# Patient Record
Sex: Female | Born: 1970
Health system: Southern US, Community
[De-identification: ages and names within clinical notes are randomized; demographics above are authoritative.]

## PROBLEM LIST (undated history)

## (undated) DIAGNOSIS — K5090 Crohn's disease, unspecified, without complications: Secondary | ICD-10-CM

## (undated) DIAGNOSIS — T7840XA Allergy, unspecified, initial encounter: Secondary | ICD-10-CM

## (undated) DIAGNOSIS — G43909 Migraine, unspecified, not intractable, without status migrainosus: Secondary | ICD-10-CM

## (undated) DIAGNOSIS — I10 Essential (primary) hypertension: Secondary | ICD-10-CM

## (undated) DIAGNOSIS — B009 Herpesviral infection, unspecified: Secondary | ICD-10-CM

## (undated) DIAGNOSIS — G35 Multiple sclerosis: Secondary | ICD-10-CM

## (undated) HISTORY — PX: UMBILICAL HERNIA REPAIR: SHX196

## (undated) HISTORY — PX: CRYOTHERAPY: SHX1416

## (undated) HISTORY — PX: FOOT SURGERY: SHX648

## (undated) HISTORY — DX: Migraine, unspecified, not intractable, without status migrainosus: G43.909

## (undated) HISTORY — DX: Multiple sclerosis: G35

## (undated) HISTORY — DX: Herpesviral infection, unspecified: B00.9

## (undated) HISTORY — DX: Allergy, unspecified, initial encounter: T78.40XA

## (undated) HISTORY — PX: LAPAROSCOPY: SHX197

---

## 1999-08-14 ENCOUNTER — Other Ambulatory Visit: Admission: RE | Admit: 1999-08-14 | Discharge: 1999-08-14 | Payer: Self-pay | Admitting: *Deleted

## 1999-11-26 HISTORY — PX: LASIK: SHX215

## 2000-08-13 ENCOUNTER — Other Ambulatory Visit: Admission: RE | Admit: 2000-08-13 | Discharge: 2000-08-13 | Payer: Self-pay | Admitting: Obstetrics and Gynecology

## 2001-08-04 ENCOUNTER — Other Ambulatory Visit: Admission: RE | Admit: 2001-08-04 | Discharge: 2001-08-04 | Payer: Self-pay | Admitting: Obstetrics and Gynecology

## 2002-08-04 ENCOUNTER — Other Ambulatory Visit: Admission: RE | Admit: 2002-08-04 | Discharge: 2002-08-04 | Payer: Self-pay | Admitting: Obstetrics and Gynecology

## 2002-12-31 ENCOUNTER — Ambulatory Visit (HOSPITAL_COMMUNITY): Admission: RE | Admit: 2002-12-31 | Discharge: 2002-12-31 | Payer: Self-pay | Admitting: Obstetrics and Gynecology

## 2002-12-31 ENCOUNTER — Encounter: Payer: Self-pay | Admitting: Obstetrics and Gynecology

## 2003-08-25 ENCOUNTER — Other Ambulatory Visit: Admission: RE | Admit: 2003-08-25 | Discharge: 2003-08-25 | Payer: Self-pay | Admitting: Obstetrics and Gynecology

## 2003-10-12 ENCOUNTER — Ambulatory Visit: Admission: RE | Admit: 2003-10-12 | Discharge: 2003-10-12 | Payer: Self-pay | Admitting: Gynecologic Oncology

## 2003-12-01 ENCOUNTER — Encounter (INDEPENDENT_AMBULATORY_CARE_PROVIDER_SITE_OTHER): Payer: Self-pay | Admitting: *Deleted

## 2003-12-01 ENCOUNTER — Encounter (INDEPENDENT_AMBULATORY_CARE_PROVIDER_SITE_OTHER): Payer: Self-pay | Admitting: Specialist

## 2003-12-01 ENCOUNTER — Ambulatory Visit (HOSPITAL_COMMUNITY): Admission: RE | Admit: 2003-12-01 | Discharge: 2003-12-01 | Payer: Self-pay | Admitting: Obstetrics and Gynecology

## 2004-06-05 ENCOUNTER — Encounter: Admission: RE | Admit: 2004-06-05 | Discharge: 2004-06-05 | Payer: Self-pay | Admitting: Internal Medicine

## 2005-01-09 ENCOUNTER — Other Ambulatory Visit: Admission: RE | Admit: 2005-01-09 | Discharge: 2005-01-09 | Payer: Self-pay | Admitting: Obstetrics and Gynecology

## 2005-02-13 ENCOUNTER — Ambulatory Visit (HOSPITAL_COMMUNITY): Admission: RE | Admit: 2005-02-13 | Discharge: 2005-02-13 | Payer: Self-pay | Admitting: Obstetrics and Gynecology

## 2005-09-13 ENCOUNTER — Encounter (HOSPITAL_COMMUNITY): Admission: AD | Admit: 2005-09-13 | Discharge: 2005-10-13 | Payer: Self-pay | Admitting: Obstetrics and Gynecology

## 2006-01-09 ENCOUNTER — Other Ambulatory Visit: Admission: RE | Admit: 2006-01-09 | Discharge: 2006-01-09 | Payer: Self-pay | Admitting: Obstetrics and Gynecology

## 2007-06-02 ENCOUNTER — Ambulatory Visit (HOSPITAL_COMMUNITY): Admission: RE | Admit: 2007-06-02 | Discharge: 2007-06-02 | Payer: Self-pay | Admitting: Obstetrics and Gynecology

## 2007-06-08 ENCOUNTER — Ambulatory Visit (HOSPITAL_COMMUNITY): Admission: RE | Admit: 2007-06-08 | Discharge: 2007-06-08 | Payer: Self-pay | Admitting: Obstetrics and Gynecology

## 2007-06-27 ENCOUNTER — Inpatient Hospital Stay (HOSPITAL_COMMUNITY): Admission: AD | Admit: 2007-06-27 | Discharge: 2007-07-03 | Payer: Self-pay | Admitting: Obstetrics and Gynecology

## 2007-06-28 ENCOUNTER — Encounter (INDEPENDENT_AMBULATORY_CARE_PROVIDER_SITE_OTHER): Payer: Self-pay | Admitting: Obstetrics and Gynecology

## 2007-07-19 ENCOUNTER — Inpatient Hospital Stay (HOSPITAL_COMMUNITY): Admission: AD | Admit: 2007-07-19 | Discharge: 2007-07-19 | Payer: Self-pay | Admitting: Obstetrics and Gynecology

## 2009-10-10 ENCOUNTER — Encounter: Admission: RE | Admit: 2009-10-10 | Discharge: 2009-10-10 | Payer: Self-pay | Admitting: Family Medicine

## 2010-12-16 ENCOUNTER — Encounter: Payer: Self-pay | Admitting: Obstetrics and Gynecology

## 2010-12-16 ENCOUNTER — Encounter: Payer: Self-pay | Admitting: Diagnostic Neuroimaging

## 2011-03-08 ENCOUNTER — Other Ambulatory Visit: Payer: Self-pay | Admitting: Family Medicine

## 2011-03-08 DIAGNOSIS — E049 Nontoxic goiter, unspecified: Secondary | ICD-10-CM

## 2011-04-09 NOTE — H&P (Signed)
Ashley Diaz, Ashley Diaz                 ACCOUNT NO.:  [id number]   MEDICAL RECORD NO.:  [id number]          PATIENT TYPE:  INP   LOCATION:  9157                          FACILITY:  [facility]   PHYSICIAN:  Eli Hose, M.D.DATE OF BIRTH:  Feb 20, 1971   DATE OF ADMISSION:  06/27/2007  DATE OF DISCHARGE:                              HISTORY & PHYSICAL   HISTORY OF PRESENT ILLNESS:  Ashley Diaz is a 40 year old gravida 2, para  0-0-1-0 at 23-4/7 weeks, who presented tonight complaining of shortness  of breath and fast heart rate. She reports this had started over the  last 24 hours. She denies any leaking or bleeding. Reports positive  fetal movement. She does not have any history of headache, visual  symptoms, or epigastric pain. She has no history of asthma or  hypertension. While in Maternity Admissions Unit, she was noted to have  elevated blood pressures, proteinuria and elevations of her liver  function tests and diminished platelets. She is therefore to be admitted  for further care secondary to pre-eclampsia and possible HELLP syndrome.  Pregnancy has been remarkable for:  1. Advanced maternal age. The patient did have an elevated risk of      Down's syndrome of 1 to 5 with her first trimester screen. She had      an amnio that on preliminary results showed normal chromosome      number but had a balanced Robertsonian 13 to 22 translocation      without clinical stigmata identified. Followup scan did show still      a questionable abnormal hepatic vein, a cystic nuchal region, and a      questionable VSD and dilated right atrium. The baby had had a      possible cystic hygroma on the first trimester screen.  2. History of infertility with this being a spontaneous conception.  3. First trimester spotting.  4. 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  5. History of atypical cells on path with negative HPV with repeat due      in August of 2008.   PRENATAL LABORATORY DATA:   Blood type is A positive. Rh antibody  negative. VDRL non-reactive. Rubella titer positive. Hepatitis B surface  antigen negative. Sickle test was negative. Gonorrhea and Chlamydia  cultures were negative in the first trimester. Pap showed ascus in  February 2008 with negative HPV. Rubella titer was immune. HIV was non-  reactive. Hemoglobin upon entering the practice was 13.3.   HISTORY OF PRESENT PREGNANCY:  The patient entered care at approximately  10 weeks. She desired first trimester screening but initially declined  amnio. On first trimester screen, she reported that she had the  previously reported elevated Down syndrome risk of 1 in 5. There was  also a cystic hygroma noted on amniocentesis. Amnio results were  reviewed with Dr. __________ at [facility]. [facility] evaluation final  results showed a balanced Robertsonian 14 to 50 translocation with a  female without clinical stigmata. Chromosome analysis was done of both  patient and her husband and the father was  noted to have this 13 to 22  translocation. The patient had a consult at Maternal Fetal Medicine. No  consequences were anticipated with this pregnancy. Offspring should be  fine but an amnio was recommended for future pregnancy. There were no  associated structural anomalies identified with this translocation.  However, on ultrasound, there was a questionable abnormal hepatic vein,  a cystic nuchal region, and a questionable VSD and dilated right atrium.  Fetal echocardiogram was planned and maternal fetal medicine consult was  also planned as well. The fetal echocardiogram was yet to be performed.  The patient then in the last 12 hours, began to have the symptoms as  noted previously.   OBSTETRICAL HISTORY:  In 2006, patient had a spontaneous miscarriage.   PAST MEDICAL HISTORY:  In 2008 she had an abnormal pap and plan was to  repeat that in August. She has used Clomid in the past but this was a  spontaneous  conception. She has been treated with Valtrex for HSV but  has had no recent or current lesions. She had a yeast infection after  antibiotic therapy. She reports usual childhood illnesses. She has a  history of a UTI.   PAST SURGICAL HISTORY:  Includes wisdom teeth removed in the past. Laser  surgery and surgery on her feet for flat feet at 31 to 40 years of age  and a hernia repair in 1993.   FAMILY HISTORY:  Her mother, maternal grandmother, and maternal  grandfather are all hypertensive. Mother has varicosities. Her mother,  sister, father, and maternal grandfather are all adult onset diabetes.  Her mother has thyroid disease. Her maternal grandmother had a stroke.  The patient's sister has migraines. Her father has rheumatoid arthritis.  Her father also has lung cancer. Her paternal grandmother had a brain  tumor. Maternal grandmother had a history of depression.   GENETIC HISTORY:  Remarkable for the patients' age of 56 and there are  twins on the father's side of the family.   SOCIAL HISTORY:  The patient is married to the father of the baby. He is  involved and supportive. His name is Deania Siguenza. The patient is  African-American and of the [place] faith. She is college educated.  She is a Airline pilot. Her husband is college  educated. He is a part Press photographer at [employer]. She denies any alcohol,  drug, or tobacco use during this pregnancy. She has been followed by the  physician service at [facility].   PHYSICAL EXAMINATION:  VITAL SIGNS:  Blood pressures in Maternity  Admissions have ranged from 223 to 361 systolic to 91 to 224 systolic.  The patient is having no pain. Other vital signs are stable. Pulse is  66. Respiratory rate 18 to 20. O2 saturation 99%.  HEENT:  Within normal limits.  LUNGS:  Breath sounds are clear.  HEART:  Regular rate and rhythm. Without murmur.  BREAST:  Soft and non-tender.   ABDOMEN:  Fundal height is approximately 23 to 24 cm. Soft, nontender,  with no right upper quadrant tenderness.  PELVIC:  Examination deferred.  EXTREMITIES:  Deep tendon reflexes 3+ without clonus. There is a trace  edema noted.   LABORATORY DATA:  CBC shows a hemoglobin of 11.3. Hemoglobin of 32.8.  White blood cell count 9.9. Platelet count of 126,000. Comprehensive  metabolic panel shows sodium of 136, potassium 3.9, chloride 109, CO2  19, glucose 89, BUN 17, creatinine 0.88, calcium 8.5,  total protein 5.4,  albumin 2.4. SGOT was elevated at 48 and SGPT was 51. Uric acid was 6.9  and LDH was 306, which was elevated. Urine sample shows specific gravity  of 1.020. Moderate hemoglobin, although there are less 0 to 2 RBC's,  greater than 300 mg of protein was noted in the specimen with negative  leukocyte esterase. Fetal heart rate is reassuring for gestational age  and no contractions were noted.   IMPRESSION:  1. Intrauterine pregnancy  at 23-5/7 weeks.  2. Pre-eclampsia with possible HELLP.  3. Fetus with cystic hygroma and possible heart issues.   PLAN:  1. Admit to the [facility] for consult with Dr.      Kennis Carina as attending physician.  2. Implementation of magnesium sulfate protocol.  3. Labetalol IV for elevated blood pressure greater than 160/105.  4. Will repeat PIH labs at 8:00 p.m.  5. Betamethasone 12.5 mg IM q.12 hours x2 doses.  6. Close observation of maternal fetal status.  7. Reviewed issue of pre-eclampsia and possible HELLP syndrome with      patient and her husband. Questions were answered and they seemed to      understand the current plan of care.      Cathlean Marseilles Cira Servant, C.N.M.      Eli Hose, M.D.  Electronically Signed    VLL/MEDQ  D:  06/27/2007  T:  06/27/2007  Job:  [id number]

## 2011-04-09 NOTE — Discharge Summary (Signed)
Ashley Diaz, LANZO                 ACCOUNT NO.:  [id number]   MEDICAL RECORD NO.:  [id number]          PATIENT TYPE:  INP   LOCATION:  9305                          FACILITY:  [facility]   PHYSICIAN:  Eldred Manges, M.D.DATE OF BIRTH:  1971/08/05   DATE OF ADMISSION:  06/27/2007  DATE OF DISCHARGE:  07/03/2007                               DISCHARGE SUMMARY   ADMISSION DIAGNOSES:  1. Intrauterine pregnancy at 23-5/7 weeks.  2. Preeclampsia with possible HELLP syndrome.  3. Fetus with cystic hygroma and possible cardiac anomaly.   DISCHARGE DIAGNOSES:  1. Intrauterine pregnancy at 23-5/7 weeks.  2. Preeclampsia.  3. HELLP syndrome.  4. Oligohydramnios.  5. Fibroids.  6. Severe prematurity with neonatal loss.  7. Postpartum hypertension   PROCEDURES:  1. Primary low transverse cesarean section.  2. Spinal anesthesia.   HOSPITAL COURSE:  Ashley Diaz is a 40 year old gravida 1, para 0 at 51-  5/7 weeks who presented to the maternity admissions unit complaining of  shortness of breath and heart pounding today. Symptoms were more  pronounced while lying down.  She was seen in maternity admissions unit  on the afternoon of June 27, 2007, and she was found to have elevated  blood pressure in 160-180 over 98-103. Her SGOT, SGPT were elevated at  48 and 51 respectively.  She had greater than 300 mg of protein on a  voided specimen.  Her platelet count was 126.  She was therefore  admitted for presumptive preeclampsia with possible HELLP syndrome.   Her pregnancy had been remarkable for:  1. Advanced maternal age. Amnio was performed with a balance      translocation Robertsonian noted.  There was no clinical stigmata      associated with this. There was a questionable abnormal hepatic      vein cystic nuchal region questionable ventricular septal defect      and dilated right atrium.  2. History of infertility.  3. First trimester spotting.  4. History of HSV II with no recent or  current lesions.  5. Atypical cells on Pap with negative high-risk HPV and a plan for      repeat in August 2008.   HOSPITAL COURSE:  The patient was admitted to the antenatal unit. She  was placed on magnesium sulfate.  She was given betamethasone.  She also  was placed on IV labetalol for blood pressure management on an as-needed  basis. Betamethasone was repeated in 12 hours. Neonatal medicine was  consulted, maternal fetal medicine was also consulted. On the evening of  August 2, repeat values of platelets were 119,000, SGOT was 52, SGPT was  54, LDH was 346. All these represented changes from the original values.  Blood pressure was still 155/99. Dr. Raphael Gibney held a long, station with  the patient her husband regarding the options for care.  They seemed to  understand that the morbidity and mortality risk for infant were very  high, that she may require classical cesarean. They did elect to proceed  with C-section if needed. Labs were checked again at 3:35 in the  morning  on June 28, 2007, platelets were down to 101,000, SGOT and SGPT were 51  respectively.  The patient was taken to the operating room where Dr.  Raphael Gibney performed a primary low transverse cesarean section.  Infant  was a female, weight 441 grams.  Apgars were 3, 5 and 7. Cord pH was  7.23.  Infant of course was taken to NICU and placenta was taken to  pathology. By later that day baby was fairly stable given its guarded  condition.  Blood pressures were improved.  The patient remained on  magnesium sulfate. Labs were checked later that day and SGOT and SGPT  were 52 and 50 respectively.  Platelets were down to 97,000, hemoglobin  was 11.8.  Urine output was fairly adequate and blood pressure was  stabilizing.  On the morning of August 4, blood pressures again were  improving.  Her SGOT was 45, SGPT was 46, platelets were 95,000.  Magnesium sulfate was discontinued later that afternoon. The infant did  pass away on  the late evening of August 4, early on the morning of  August 5. Chaplaincy was contacted, there were some issues with cardiac  anomalies on the baby. The patient did have the opportunity to hold and  cuddle the infant prior to the infant's passing. The patient originally  had gone out to the floor but then was transferred back to AICU from  antenatal for further preeclampsia care. At 5:00 a.m. on August 5,  hemoglobin was 8.3, SGOT was up to 122 and SGPT was 118. Platelet count  on August 5, was found to be 65,000.  This eventually represented the  nadir of her platelet count. She was placed on HCTZ 25 mg, Procardia XL  through 30 mg daily was begun and she was on a p.r.n. labetalol dosing  for elevated blood pressure. PIH labs, ANA, anticardiolipin antibody and  lupus anticoagulant antibodies were drawn on August 5. Later that day  SGOT, SGPT were still elevated at 114 and 117, platelet count was  69,000. Procardia dose was increased to 60 mg p.o.  By postop day #3 the  patient was coping emotionally but was certainly struggling with a loss  of her baby.  She tended to get tachycardia with ambulation but was  having no dizziness or shortness of breath. Her SGOT was 149, SGPT was  157.  Her platelet count was 67. She had lost approximately 2-1/2 pounds  of weight, urine output was adequate.  Orthostatics were stable. On  August 7, SGOT and SGPT were 183 and 215.  They had been elevated to a  max of 230 and 247. On August 6, platelet count was 70,000, hemoglobin  was 9.3, ANA, lupus anticoagulant were both negative. On the morning of  August 8, the patient was feeling better.  She was awaiting autopsy  results and the memorial service for her infant was scheduled for that  day. Her blood pressures were in the 120-168 over 90-104.  She was  placed on Procardia 90 mg, she is also to be on HCTZ 25 mg p.o. daily.  Her SGOT and SGPT had diminished to 115 and 174 retrospectively,  platelet count  was 73. Dr. Leo Grosser was consulted and her incision was  clean, dry and intact.  Her physical exam was otherwise within normal  limits.  Dr. Leo Grosser was consulted and the decision was made to  discharge the patient home.   DISCHARGE CONDITION:  Was stable.   DISCHARGE INSTRUCTIONS:  The patient is to continue to monitor for signs  and symptoms of worsening blood pressure with headache, visual symptoms,  epigastric pain or worsening swelling. The discharge instructions are  per the usual postop C-section instructions.   DISCHARGE MEDICATIONS:  1. Procardia 90 mg XL daily.  2. HCTZ 25 mg one p.o. daily.  3. Motrin 600 grams p.o. q.6 hours p.r.n. pain.  4. Percocet 5/325 one to two p.o. q.3-4 hours p.r.n. pain.   Discharge follow-up will occur on Monday with smart start nurse for  blood pressure check and support to the patient and her husband for  their loss was provided. They also were given information on the  neonatal loss program.      Jocelyn Lamer L. Cira Servant, C.N.M.      Eldred Manges, M.D.  Electronically Signed    VLL/MEDQ  D:  07/07/2007  T:  07/07/2007  Job:  [id number]

## 2011-04-09 NOTE — Op Note (Signed)
Ashley Diaz, Ashley Diaz                 ACCOUNT NO.:  [id number]   MEDICAL RECORD NO.:  [id number]          PATIENT TYPE:  INP   LOCATION:  9157                          FACILITY:  [facility]   PHYSICIAN:  Eli Hose, M.D.DATE OF BIRTH:  08/09/1971   DATE OF PROCEDURE:  06/28/2007  DATE OF DISCHARGE:                               OPERATIVE REPORT   PREOPERATIVE DIAGNOSES:  1. 23-5/[redacted] weeks gestation.  2. Preeclampsia.  3. Possible hemolysis, elevated liver enzymes and low platelet count      help syndrome.   POSTOPERATIVE DIAGNOSIS:  1. 23-5/[redacted] weeks gestation.  2. Preeclampsia.  3. Possible hemolysis, elevated liver enzymes and low platelet count      help syndrome.  4. Oligohydramnios and  5. Fibroid uterus.   PROCEDURE:  Primary low transverse cesarean section.   SURGEON:  Eli Hose, M.D.   FIRST ASSISTANT:  Donnel Saxon, C.N.M.   ANESTHESIA:  Spinal.   DISPOSITION:  Ashley Diaz is a 40 year old female, gravida 1, para 0,  who presents at 23-5/[redacted] weeks gestation.  She has been followed at the  [facility].  Her  pregnancy has been complicated by the fact that she was found to have a  balanced translocation (Robinsonium).  This was thought to have no  clinical significance.  There was a question, however,. of an abnormal  hepatic vein, and abnormal nuchal region.  There was a question of a  ventricular septal defect and the question of a dilated right atrium.  The patient presented on June 27, 2007 and was noted to have elevated  blood pressures, significant proteinuria, low platelets, and elevated  liver enzymes.  The patient was immediately started on magnesium therapy  and her labs were repeated.  Her platelet count was noted to decrease  from 126,000 to 119,000, and then 101,000.  Multiple discussions were  held with the patient and her husband about our management options.  They understood that there was at  best a 50% chance that their daughter  would survive if delivered at this point, but they also understood that  prolonging the pregnancy placed the mother at significant risk which in  turn placed the baby at risk.  After much consideration, the couple  decided that they did want to proceed with cesarean delivery.  We  discussed the possibility that a classical cesarean section would be  required which would subsequently mean that all future pregnancies will  require cesarean delivery.  The risk and benefits of cesarean section  were reviewed including, but not limited to, anesthetic complications,  bleeding, infection, and possible damage to surrounding organs.   FINDINGS:  A 441 grams female infant (Ashley Diaz) was delivered from a  cephalic presentation.  The Apgar scores were 3 at one minute and 5 at  five minutes and 7 at 10 minutes.  The arterial cord blood pH was 7.23.  There was a decreased amount of amniotic fluid noted.  The ovaries  appeared normal for the gravid state.  The fallopian tubes were normal  and  they were noted to have hydatid cyst on the right.  The uterus was  normal for the gravid state except that there were two fibroid on the  anterior fundus of the uterus measuring less than 2 cm in size each.   DESCRIPTION OF PROCEDURE:  The patient was taken to the operating room  where a spinal anesthetic was given.  The patient's abdomen, perineum,  and vagina were prepped with multiple layers of Betadine.  A Foley  catheter was placed in the bladder.  The patient was then sterilely  draped.  The lower abdomen was injected with 10 mL of 0.5% Marcaine with  epinephrine.  A low transverse incision was made in the abdomen and then  carried sharply through the subcutaneous tissue, fascia, and the  anterior peritoneum.  The bladder flap was developed.  It was determined  that we would be able to perform a low transverse cesarean section.  An  incision was made in the lower  uterine segment and it was extended in a  low transverse fashion.  The membranes were ruptured and a decreased  amount of amniotic fluid was noted.  The infant was then delivered from  cephalic presentation without difficulty.  The cord was clamped and cut.  The infant was handed to the awaiting pediatric team.  Routine cord  blood studies were obtained.  The placenta was removed.  The placenta  was sent to pathology for evaluation.  The uterine cavity was then  cleaned of amniotic fluid, clotted blood, and membranes.  The uterine  incision was closed using a running locking suture of 2-0 Vicryl  followed by imbricating suture of 2-0 Vicryl.  Hemostasis was adequate.  The pelvis was vigorously irrigated.  The anterior peritoneum was closed  using a running suture of 0 Vicryl.  The fascia and the subcutaneous  layer were irrigated.  The fascia was closed using a running suture of 0  Vicryl followed by three interrupted sutures of 0 Vicryl.  The  subcutaneous layer was closed using a running suture of 0 Vicryl.  The  skin was reapproximated using a subcuticular suture of 3-0 Monocryl.  Sponge, needle, and instrument counts were correct on two occasions.  The estimated blood loss for the procedure was 800 mL.  The patient was  noted to drain clear yellow urine at the end of her procedure.  The  patient tolerated her procedure well.  The patient was taken to the  recovery room in stable condition.  She will then be observed carefully  and placed back on magnesium.  The infant was taken to the neonatal  intensive care unit in guarded condition.      Eli Hose, M.D.  Electronically Signed     AVS/MEDQ  D:  06/28/2007  T:  06/29/2007  Job:  [id number]

## 2011-04-09 NOTE — H&P (Signed)
[name], [name]                 ACCOUNT NO.:  [id number]   MEDICAL RECORD NO.:  [id number]          PATIENT TYPE:  INP   LOCATION:  9157                          FACILITY:  [facility]   PHYSICIAN:  Eli Hose, M.D.DATE OF BIRTH:  Jun 24, 1971   DATE OF ADMISSION:  06/27/2007  DATE OF DISCHARGE:                              HISTORY & PHYSICAL   Ashley Diaz is a 40 year old gravida 1, para 0 at 23-4/7 weeks who  presented today with a complaint of shortness of breath and fast  heartbeat.  She denied any headache, visual symptoms or epigastric pain.  She denied any leaking or bleeding and reported positive fetal movement.  She has no history of any asthma, heart disease or any other problems.  While in maternity admisssions, the patient was noted to have elevated  blood pressures and proteinuria as well as changes in her liver function  tests.  It was determined that she had preeclampsia and possible HELLP  syndrome.  She is, therefore, to be admitted for further care.   Dictation ends here.      Ashley Diaz Cira Servant, C.N.M.      Eli Hose, M.D.     Ashley Diaz  D:  06/27/2007  T:  06/27/2007  Job:  [id number]

## 2011-04-12 NOTE — Consult Note (Signed)
NAME:  Ashley Diaz, Ashley Diaz                           ACCOUNT NO.:  [id number]   MEDICAL RECORD NO.:  [id number]                   PATIENT TYPE:  OUT   LOCATION:  GYN                                  FACILITY:  [facility]   PHYSICIAN:  Ashley Diaz, M.D.                 DATE OF BIRTH:  May 29, 1971   DATE OF CONSULTATION:  DATE OF DISCHARGE:                                   CONSULTATION   CHIEF COMPLAINT:  Evaluation of elevated CA 125 value.   HISTORY OF PRESENT ILLNESS:  This 40 year old woman is seen at the request  of  Ashley Diaz, M.D. for evaluation of an elevated CA 125 value.   HISTORY OF PRESENT ILLNESS:  The patient has a longstanding history of  dysmenorrhea and primary infertility. She denies dyspareunia other than  intercourse during menses. She has apparently undergone a few cycles of  artificial insemination without impregnation. She and her husband have  elected to take time off from infertility evaluation for a while.  Approximately two months ago, she had a slightly delayed period associated  with increasing pain. CA 125 was 39 and a small ovarian cyst documented  which resolved on followup ultrasound. Followup CA 125 was 37.8. Because of  the elevation, the patient was concerned that this might be associated with  cancer.   PAST MEDICAL HISTORY:  No major diagnoses or surgeries other than primary  infertility.   MEDICATIONS:  None.   ALLERGIES:  None.   FAMILY HISTORY:  Noncontributory.   PERSONAL SOCIAL HISTORY:  Denies tobacco or ethanol.   REVIEW OF SYMPTOMS:  Otherwise negative.   PHYSICAL EXAMINATION:  VITAL SIGNS:  Weight 128 pounds, blood pressure  124/72, pulse 96, respirations 20.  GENERAL:  The patient is alert and oriented x3 in no acute distress.  ENT:  Benign with clear oropharynx.  ABDOMEN:  Soft and benign without tenderness, ascites or mass.  BACK:  There is no back or CVA tenderness.  EXTREMITIES:  The legs are without edema and there is full  strength in all  extremities.  PELVIC:  External genitalia and BUS are normal to inspection and palpation.  The bladder and urethra are well supported and vagina is clear. The cervix  is mobile without lesions and there is minimal tenderness to cervical  manipulation. Bimanual and rectovaginal examinations reveal anteflex small  uterus with no adnexal pathology or tenderness. Cul-de-sac is not nodular.   ASSESSMENT:  Low grade elevation of CA 125 likely reflecting occult  endometriosis.   RECOMMENDATIONS:  I had a long discussion with the patient, basically  reassuring her that her CA 125 is not likely to be related to malignancy but  may be an indicator of occult endometriosis. We reviewed other possible  causes for an elevation of CA 125 and I answered multiple questions. The  patient's infertility evaluation has not yet lead to laparoscopy  and it  would be reasonable to perform laparoscopy; this could be carried out under  Dr. Boyd Diaz auspices and we would be glad to be available if there were any  significant findings other than related to infertility.                                               Ashley Diaz, M.D.    JTS/MEDQ  D:  10/12/2003  T:  10/13/2003  Job:  [id number]   cc:   Ashley Diaz, M.D.  [address]  [place] [address]  Fax: [phone]   Ashley Diaz, R.N.  [phone] Ashley Diaz, Ashley Diaz [id number]

## 2011-04-12 NOTE — Op Note (Signed)
NAME:  Ashley Diaz, Ashley Diaz                           ACCOUNT NO.:  [id number]   MEDICAL RECORD NO.:  [id number]                   PATIENT TYPE:  AMB   LOCATION:  SDC                                  FACILITY:  [facility]   PHYSICIAN:  Dede Query. Rivard, M.D.              DATE OF BIRTH:  29-Mar-1971   DATE OF PROCEDURE:  12/01/2003  DATE OF DISCHARGE:                                 OPERATIVE REPORT   PREOPERATIVE DIAGNOSES:  1. Infertility.  2. History of ovarian cyst with increased CA125.   POSTOPERATIVE DIAGNOSES:  1. Infertility.  2. History of ovarian cyst with increased CA125.  3. Pelvic endometriosis.  4. Paratubal cysts bilaterally.   ANESTHESIA:  General.   PROCEDURES:  Open laparoscopy with peritoneal biopsies, peritoneal washings,  ablation of endometriosis, drainage of paratubal cysts, and  chromopertubation.   SURGEON:  Dede Query. Rivard, M.D.   ASSISTANTJon Billings. Florene Glen, P.A.   DESCRIPTION OF PROCEDURE:  After being informed of the planned procedure  with possible complications including bleeding, infection, injury to bowel,  bladder, or ureters, need for laparotomy, informed consent was obtained and  the patient was taken to OR #2, given general anesthesia with endotracheal  intubation, and placed in the lithotomy position.  She is prepped and draped  in a sterile fashion and a Foley catheter is inserted in her bladder.  A  tenaculum forceps grasps the anterior lip of the cervix and an acorn  manipulator is inserted in the uterus.  We proceed with infiltration of the  umbilical area using 8 mL of Marcaine 0.25% and perform a supraumbilical  semi-elliptical incision, which is brought down bluntly to the fascia with S  retractors.  The fascia is identified, grasped with a Kocher forceps, and  opened with Mayo scissors.  The peritoneum is then identified, grasped with  a hemostat clamp, and opened with scissors.  We are in the peritoneal  cavity.  A running suture of 0  Vicryl is placed around the fascia and a  Hasson trocar is inserted easily, held in place by the previously-placed  suture.  Pneumoperitoneum is created with CO2 at a maximum pressure of 15  mmHg, and we can now insert a laparoscope on the video monitor.  A  suprapubic 5 mm trocar is inserted under direct visualization after  infiltration with 2 mL of Marcaine 0.25%.   Observation:  Anterior cul-de-sac reveals one brown, flat lesion compatible  with endometriosis.  The uterus is normal.  The right tube is normal with  normal right fimbriae but is a carrier of three small paratubal cysts  measuring 0.5 cm each.  Right ovary is completely normal.  Right ovarian  fossa is normal.  The tubo-ovarian relationship is normal.  On the left side  the left tube is normal but has two small paratubal cysts measuring 5 mm  each.  Left ovary has a site  of recent ovulation with a corpus luteum cyst  of 1.5 cm.  The tubo-ovarian relationship is normal with no adhesions.  The  posterior cul-de-sac reveals on the left uterosacral ligament an area  measuring about 1 x 2 cm covered with small, dark and clear lesions, two of  which are now biopsied for pathology.  Those are compatible with  endometriosis.  We then cauterize this whole section.  The right round  ligament also has a vesicular lesion that is biopsied for pathology and then  cauterized.  The appendix is visualized and normal.  We do see small  adhesions with the cecum and the right pelvic wall with no obvious traction.  Liver is visualized and normal.  No other lesions are identified.  The  anterior cul-de-sac lesion is also cauterized for complete ablation of  endometriotic-appearing lesions.  We then irrigate with saline  profusely,which we did before proceeding with biopsies and cauterization,  and our irrigation fluids were sent for pelvic washings.  We then proceed  with chromopertubation, which reveals two bilaterally patent tubes.  We then   irrigate again with warm saline, remove all methylene blue.  Hemostasis on  sites of biopsies is adequate.  We remove instruments after evacuating  pneumoperitoneum, and we can close the 10 mm defect in the fascia of the  umbilical area using our previously-placed sutures, assessing that no bowel  loop is protruding.  Skin is closed with subcuticular suture of 4-0 Vicryl  and Steri-Strips.   Instrument and sponge count is complete x2.  Estimated blood loss is  minimal.  The procedure is well-tolerated by the patient, who is taken to  the recovery room in a well and stable condition.                                               Dede Query Rivard, M.D.    SAR/MEDQ  D:  12/01/2003  T:  12/01/2003  Job:  [id number]

## 2011-09-09 LAB — CBC
HCT: 24.1 — ABNORMAL LOW
HCT: 26.4 — ABNORMAL LOW
HCT: 27 — ABNORMAL LOW
HCT: 31.8 — ABNORMAL LOW
HCT: 33.8 — ABNORMAL LOW
Hemoglobin: 11.8 — ABNORMAL LOW
Hemoglobin: 11.8 — ABNORMAL LOW
Hemoglobin: 9.3 — ABNORMAL LOW
Hemoglobin: 9.3 — ABNORMAL LOW
MCHC: 34
MCHC: 34.1
MCHC: 34.2
MCHC: 34.3
MCHC: 34.4
MCHC: 34.7
MCV: 86.8
MCV: 88.1
MCV: 88.2
MCV: 88.9
MCV: 89.9
MCV: 90.7
Platelets: 119 — ABNORMAL LOW
Platelets: 126 — ABNORMAL LOW
Platelets: 70 — ABNORMAL LOW
Platelets: 73 — ABNORMAL LOW
Platelets: 75 — ABNORMAL LOW
Platelets: 95 — ABNORMAL LOW
RBC: 2.71 — ABNORMAL LOW
RBC: 3.02 — ABNORMAL LOW
RBC: 3.05 — ABNORMAL LOW
RBC: 3.55 — ABNORMAL LOW
RBC: 3.73 — ABNORMAL LOW
RBC: 3.86 — ABNORMAL LOW
RDW: 13
RDW: 13
RDW: 13.2
RDW: 13.4
RDW: 13.4
RDW: 13.7
RDW: 13.8
RDW: 13.8
RDW: 13.9
WBC: 14.2 — ABNORMAL HIGH
WBC: 17.9 — ABNORMAL HIGH
WBC: 19.9 — ABNORMAL HIGH
WBC: 21.9 — ABNORMAL HIGH

## 2011-09-09 LAB — COMPREHENSIVE METABOLIC PANEL
ALT: 117 — ABNORMAL HIGH
ALT: 157 — ABNORMAL HIGH
ALT: 215 — ABNORMAL HIGH
ALT: 50 — ABNORMAL HIGH
ALT: 51 — ABNORMAL HIGH
AST: 122 — ABNORMAL HIGH
AST: 183 — ABNORMAL HIGH
AST: 230 — ABNORMAL HIGH
AST: 48 — ABNORMAL HIGH
Albumin: 2.1 — ABNORMAL LOW
Albumin: 2.1 — ABNORMAL LOW
Albumin: 2.1 — ABNORMAL LOW
Albumin: 2.3 — ABNORMAL LOW
Albumin: 2.4 — ABNORMAL LOW
Alkaline Phosphatase: 101
Alkaline Phosphatase: 118 — ABNORMAL HIGH
Alkaline Phosphatase: 124 — ABNORMAL HIGH
Alkaline Phosphatase: 89
BUN: 10
BUN: 12
BUN: 13
BUN: 17
BUN: 17
BUN: 6
BUN: 8
CO2: 19
CO2: 20
CO2: 24
CO2: 24
CO2: 25
CO2: 26
CO2: 26
Calcium: 7.6 — ABNORMAL LOW
Calcium: 8 — ABNORMAL LOW
Calcium: 8.4
Calcium: 8.5
Calcium: 8.5
Calcium: 8.6
Chloride: 101
Chloride: 102
Chloride: 105
Chloride: 107
Creatinine, Ser: 0.76
Creatinine, Ser: 0.77
Creatinine, Ser: 0.78
Creatinine, Ser: 0.79
Creatinine, Ser: 0.88
Creatinine, Ser: 0.95
Creatinine, Ser: 1.01
GFR calc Af Amer: >60
GFR calc Af Amer: >60
GFR calc Af Amer: >60
GFR calc Af Amer: >60
GFR calc non Af Amer: >60
GFR calc non Af Amer: >60
GFR calc non Af Amer: >60
GFR calc non Af Amer: >60
GFR calc non Af Amer: >60
GFR calc non Af Amer: >60
Glucose, Bld: 121 — ABNORMAL HIGH
Glucose, Bld: 129 — ABNORMAL HIGH
Glucose, Bld: 132 — ABNORMAL HIGH
Glucose, Bld: 76
Glucose, Bld: 95
Potassium: 3.7
Potassium: 4.3
Potassium: 4.5
Potassium: 4.5
Sodium: 133 — ABNORMAL LOW
Sodium: 133 — ABNORMAL LOW
Sodium: 133 — ABNORMAL LOW
Sodium: 135
Sodium: 136
Total Bilirubin: 0.6
Total Bilirubin: 1.1
Total Bilirubin: 1.4 — ABNORMAL HIGH
Total Protein: 4.6 — ABNORMAL LOW
Total Protein: 4.8 — ABNORMAL LOW
Total Protein: 5 — ABNORMAL LOW
Total Protein: 5.3 — ABNORMAL LOW
Total Protein: 5.4 — ABNORMAL LOW
Total Protein: 5.6 — ABNORMAL LOW
Total Protein: 5.6 — ABNORMAL LOW

## 2011-09-09 LAB — LUPUS ANTICOAGULANT PANEL
DRVVT: 33.5 — ABNORMAL LOW (ref 36.1–47.0)
Lupus Anticoagulant: NOT DETECTED
PTT Lupus Anticoagulant: 39.2 (ref 36.3–48.8)

## 2011-09-09 LAB — URINALYSIS, ROUTINE W REFLEX MICROSCOPIC
Glucose, UA: NEGATIVE
Leukocytes, UA: NEGATIVE
Protein, ur: >300 — AB
Urobilinogen, UA: 0.2

## 2011-09-09 LAB — URINE MICROSCOPIC-ADD ON

## 2011-09-09 LAB — MAGNESIUM
Magnesium: 4.4 — ABNORMAL HIGH
Magnesium: 4.7 — ABNORMAL HIGH
Magnesium: 6.9

## 2011-09-09 LAB — RPR: RPR Ser Ql: NONREACTIVE

## 2011-09-09 LAB — LACTATE DEHYDROGENASE
LDH: 385 — ABNORMAL HIGH
LDH: 459 — ABNORMAL HIGH
LDH: 634 — ABNORMAL HIGH

## 2011-09-09 LAB — CARDIOLIPIN ANTIBODIES, IGG, IGM, IGA: Anticardiolipin IgA: <7 — ABNORMAL LOW (ref <13)

## 2011-09-09 LAB — URIC ACID
Uric Acid, Serum: 6.9
Uric Acid, Serum: 7.1 — ABNORMAL HIGH

## 2011-09-12 ENCOUNTER — Ambulatory Visit
Admission: RE | Admit: 2011-09-12 | Discharge: 2011-09-12 | Disposition: A | Discharge status: Home / Self Care | Payer: BC Managed Care – PPO | Source: Ambulatory Visit | Attending: Family Medicine | Admitting: Family Medicine

## 2011-09-12 DIAGNOSIS — E049 Nontoxic goiter, unspecified: Secondary | ICD-10-CM

## 2011-09-17 ENCOUNTER — Emergency Department (HOSPITAL_COMMUNITY)
Admission: EM | Admit: 2011-09-17 | Discharge: 2011-09-17 | Disposition: A | Discharge status: Home / Self Care | Payer: BC Managed Care – PPO | Attending: Emergency Medicine | Admitting: Emergency Medicine

## 2011-09-17 DIAGNOSIS — L509 Urticaria, unspecified: Secondary | ICD-10-CM | POA: Insufficient documentation

## 2012-02-04 ENCOUNTER — Ambulatory Visit: Payer: Self-pay | Admitting: Obstetrics and Gynecology

## 2012-02-28 DIAGNOSIS — G43909 Migraine, unspecified, not intractable, without status migrainosus: Secondary | ICD-10-CM | POA: Insufficient documentation

## 2012-02-28 DIAGNOSIS — Z8719 Personal history of other diseases of the digestive system: Secondary | ICD-10-CM

## 2012-02-28 DIAGNOSIS — G35D Multiple sclerosis, unspecified: Secondary | ICD-10-CM

## 2012-02-28 DIAGNOSIS — G35 Multiple sclerosis: Secondary | ICD-10-CM

## 2012-02-28 DIAGNOSIS — B009 Herpesviral infection, unspecified: Secondary | ICD-10-CM

## 2012-03-04 ENCOUNTER — Ambulatory Visit (INDEPENDENT_AMBULATORY_CARE_PROVIDER_SITE_OTHER): Payer: BC Managed Care – PPO | Admitting: Obstetrics and Gynecology

## 2012-03-04 ENCOUNTER — Encounter: Payer: Self-pay | Admitting: Obstetrics and Gynecology

## 2012-03-04 VITALS — BP 118/78 | Ht 61.0 in | Wt 155.0 lb

## 2012-03-04 DIAGNOSIS — Z01419 Encounter for gynecological examination (general) (routine) without abnormal findings: Secondary | ICD-10-CM

## 2012-03-04 DIAGNOSIS — G35 Multiple sclerosis: Secondary | ICD-10-CM | POA: Insufficient documentation

## 2012-03-04 DIAGNOSIS — Z124 Encounter for screening for malignant neoplasm of cervix: Secondary | ICD-10-CM

## 2012-03-04 NOTE — Progress Notes (Signed)
Subjective:    Ashley Diaz is a 41 y.o. female G2P0020 who presents for annual exam. Known for endometriosis and MS. The patient has no complaints today.   Menstrual cycle monthly, normal flow, no BTB, no dysmenorrhea  The patient  History  Sexual Activity  . Sexually Active: Yes  . Birth Control/ Protection: None   History  Smoking status  . Never Smoker   Smokeless tobacco  . Not on file   and  History  Alcohol Use No  .  Last Pap: was normal  2012 Last mammogram: was normal    The following portions of the patient's history were reviewed and updated as appropriate: allergies, current medications, past family history, past medical history, past social history, past surgical history and problem list.  Review of Systems Pertinent items are noted in HPI. Gastrointestinal:No change in bowel habits, no abdominal pain, no rectal bleeding Genitourinary:negative for dysuria, frequency, hematuria, nocturia and urinary incontinence    Objective:     BP 118/78  Ht 5' 1"  (1.549 m)  Wt 155 lb (70.308 kg)  BMI 29.29 kg/m2  LMP 03/02/2012 Weight:  Wt Readings from Last 1 Encounters:  03/04/12 155 lb (70.308 kg)   BMI: Body mass index is 29.29 kg/(m^2). General Appearance: Alert, appropriate appearance for age. No acute distress HEENT: Grossly normal Neck / Thyroid: Supple, no masses, nodes or enlargement Lungs: clear to auscultation bilaterally Back: No CVA tenderness Breast Exam: No dimpling, nipple retraction or discharge. No masses or nodes. and No masses or nodes.No dimpling, nipple retraction or discharge. Cardiovascular: Regular rate and rhythm. S1, S2, no murmur Gastrointestinal: Soft, non-tender, no masses or organomegaly Pelvic Exam: Vulva and vagina appear normal. Bimanual exam reveals normal uterus and adnexa. Rectovaginal: not indicated Lymphatic Exam: Non-palpable nodes in neck, clavicular, axillary, or inguinal regions Skin: no rash or  abnormalities Neurologic: Normal gait and speech, no tremor  Psychiatric: Alert and oriented, appropriate affect.    Urinalysis:Not done      Assessment:    Normal gyn exam    Plan:    Await pap smear results.   Follow-up:  for annual exam

## 2012-03-05 LAB — PAP IG W/ RFLX HPV ASCU

## 2013-02-08 ENCOUNTER — Other Ambulatory Visit: Payer: Self-pay | Admitting: Neurology

## 2013-02-08 DIAGNOSIS — G35 Multiple sclerosis: Secondary | ICD-10-CM

## 2013-02-10 ENCOUNTER — Other Ambulatory Visit: Payer: Self-pay

## 2013-02-10 DIAGNOSIS — Z1231 Encounter for screening mammogram for malignant neoplasm of breast: Secondary | ICD-10-CM

## 2013-03-02 ENCOUNTER — Ambulatory Visit
Admission: RE | Admit: 2013-03-02 | Discharge: 2013-03-02 | Disposition: A | Discharge status: Home / Self Care | Payer: BC Managed Care – PPO | Source: Ambulatory Visit

## 2013-03-02 DIAGNOSIS — Z1231 Encounter for screening mammogram for malignant neoplasm of breast: Secondary | ICD-10-CM

## 2013-03-03 ENCOUNTER — Ambulatory Visit (INDEPENDENT_AMBULATORY_CARE_PROVIDER_SITE_OTHER): Payer: BC Managed Care – PPO

## 2013-03-03 ENCOUNTER — Telehealth: Payer: Self-pay

## 2013-03-03 ENCOUNTER — Ambulatory Visit (INDEPENDENT_AMBULATORY_CARE_PROVIDER_SITE_OTHER): Payer: BC Managed Care – PPO | Admitting: Neurology

## 2013-03-03 VITALS — BP 139/87 | HR 74 | Temp 98.4°F | Resp 18 | Wt 156.2 lb

## 2013-03-03 DIAGNOSIS — G35 Multiple sclerosis: Secondary | ICD-10-CM

## 2013-03-03 NOTE — Telephone Encounter (Signed)
Dispensed Samples of Gilenya 0.26m caps #14 Lot [id number] 06/2013

## 2013-03-03 NOTE — Progress Notes (Signed)
Clinical history: 42 yo with RRMS, was randomized to fingolimod in 04/01/2012.  HPI: 42 year old right-handed female with history of migraine headaches    Sep 2010, The patient woke up one morning and felt the sensation of pins and needles the bottom of her feet.   The numbness progressed up to the level of just below her knees.    Diagnosed with MS by MRI brain, t-spine,   and LP (12 OCBs). She  was on rebif 04/27/10 until Feb 2012    MRI brain:  Multiple chronic demyelinating plaques.  No acute demyelinating plaques. In comparison to the prior MRI from 12/14/09, there is no interval change. 01/04/10 MRI cervical spine - normal 01/04/10 MRI thoracic spine - ill defined spianl cord hyperintensity at T9 segment likely remote age demyelinating plaque.  Physical Exam  General: Patient is awake, alert and in no acute distress.  Well developed and groomed. Head: normal cephalic Ears, Nose and Throat: normal Neck: supple no carotid bruits Respiratory: clear to auscultation bilaterally Cardiovascular: regular rate rhythm Musculoskeletal: no deformity Skin: normal Trunk: normal  Neurologic Exam  Mental Status: pleasant, awake, alert, cooperative to history, talking, and casual conversation. Cranial Nerves: CN II-XII pupils were equal round reactive to light.  Fundi were sharp bilaterally.  Extraocular movements were full.  Visual fields were full on confrontational test.  Facial sensation and strength were normal.  Hearing was intact to finger rubbing bilaterally.  Uvula tongue were midline.  Head turning and shoulder shrugging were normal and symmetric.  Tongue protrusion into the cheeks strength were normal.  Motor: Normal tone, bulk, and strength. Sensory: Normal to light touch, pinprick, proprioception, and vibratory sensation. Coordination: Normal finger-to-nose, heel-to-shin.  There was no dysmetria noticed. Gait and Station: Narrow based and steady, was able to perform tiptoe, heel, and tandem  walking without difficulty.  Romberg sign: Negative.   Reflexes: Deep tendon reflexes: Biceps: 2/2, Brachioradialis: 2/2 Triceps: 2/2, Pateller: 2/2, Achilles: 2/2.  Plantar responses are flexor.     Patient here for her Visit 7B/ End of Study in PREFER MS study.  Pt is randomized to Fingolimod.  Pt has had no change in Con Meds, no AE's or SAE's and has no new or worsening MS symptoms since last contact.  Labs, Bio-markers, MRI, Pros, MSQLI, MSFC, 9-Hole Peg test, PASAT, Timed 25-Foot Walk and SDMT was completed as per protocol.  Dr.Yan in to complete ambulation, bradycardia assessed, neuro and physical exam. EDSS score was 1.0.  Study drug bottles was returned with 20 pills. Patient took last study dose on 03/03/13. Patient will continue on Gilenya. Patient was given samples until she is approve for patient assistance.

## 2013-03-04 ENCOUNTER — Other Ambulatory Visit: Payer: Self-pay | Admitting: Obstetrics and Gynecology

## 2013-03-04 DIAGNOSIS — R928 Other abnormal and inconclusive findings on diagnostic imaging of breast: Secondary | ICD-10-CM

## 2013-03-04 MED ORDER — GADOPENTETATE DIMEGLUMINE 469.01 MG/ML IV SOLN: 14.0000 mL | Freq: Once | INTRAVENOUS | Status: AC | PRN

## 2013-03-04 NOTE — Progress Notes (Signed)
Quick Note:  Please call patient, no significant changes on MRI brain ______

## 2013-03-04 NOTE — Procedures (Signed)
[facility]  NEUROIMAGING REPORT   STUDY DATE: 03/03/13 PATIENT NAME: Ashley Diaz DOB: 12-Dec-1970 MRN: [id number]  ORDERING CLINICIAN: Marcial Pacas, MD  CLINICAL HISTORY: 42 year old female with multiple sclerosis.  EXAM: MRI brain (with and without)  TECHNIQUE: MRI of the brain with and without contrast was obtained utilizing 5 mm axial slices with PD, 3DT1, T2 views.  Post contrast 3DT1 and T2FLAIR views also obtained. CONTRAST: 52m magnevist IMAGING SITE: [facility] [address]   FINDINGS:  The cortical sulci, fissures and cisterns are normal in size and appearance. Lateral, third and fourth ventricle are normal in size and appearance. No extra-axial fluid collections are seen. No evidence of mass effect or midline shift.    Few periventricular and subcortical chronic demyelinating plaques. No abnormal lesions are seen on post contrast views.    On sagittal views the posterior fossa, pituitary gland and corpus callosum are unremarkable.  The orbits and their contents, paranasal sinuses and calvarium are unremarkable.  Intracranial flow voids are present.  IMPRESSION:  Abnormal MRI brain (with and without contrast) demonstrating: 1. Few periventricular and subcortical chronic demyelinating plaques.  2. No acute plaques. 3. No significant change from MRI on 09/16/12.   INTERPRETING PHYSICIAN:  VPenni Bombard MD Certified in Neurology, Neurophysiology and Neuroimaging  [facility] [address] [place] [phone]

## 2013-03-15 ENCOUNTER — Telehealth: Payer: Self-pay

## 2013-03-15 NOTE — Telephone Encounter (Signed)
Calling to advise [place] Gilenya is not covered under patient's insurance and requires prior auth.  Can be reached at [phone] with any questions.  I will initiate prior auth.

## 2013-03-17 ENCOUNTER — Telehealth: Payer: Self-pay | Admitting: Diagnostic Neuroimaging

## 2013-03-17 ENCOUNTER — Ambulatory Visit
Admission: RE | Admit: 2013-03-17 | Discharge: 2013-03-17 | Disposition: A | Discharge status: Home / Self Care | Payer: BC Managed Care – PPO | Source: Ambulatory Visit | Attending: Obstetrics and Gynecology | Admitting: Obstetrics and Gynecology

## 2013-03-17 DIAGNOSIS — R928 Other abnormal and inconclusive findings on diagnostic imaging of breast: Secondary | ICD-10-CM

## 2013-03-17 NOTE — Telephone Encounter (Signed)
Call from [facility] @ [place]. She received a call from Omer Jack regarding prior authorization for [place]. When she processed the claim it did pay. She is call for Authorization Code and Authorization dates.

## 2013-03-17 NOTE — Telephone Encounter (Signed)
Prior Ashley Diaz was sent.  Pending Ins response.  We do not have auth dates or any auth numbers, as the PA has not yet been authorized.

## 2013-03-24 MED ORDER — FINGOLIMOD HCL 0.5 MG PO CAPS: 0.5000 mg | ORAL_CAPSULE | Freq: Once | ORAL | Status: DC

## 2013-03-24 NOTE — Addendum Note (Signed)
Addended by: Norva Pavlov C on: 03/24/2013 01:15 PM   Modules accepted: Orders

## 2013-03-24 NOTE — Telephone Encounter (Signed)
Abigail Butts spoke with [facility], so we do not need to call them back, they only need [place] to send a new Rx, which I have done.

## 2013-03-24 NOTE — Telephone Encounter (Signed)
Abigail Butts from [employer] called and said they did get confirmation the prior auth was approved for this patient effective 03/16/2013-09/30/2015 Ref # [id number].  She said [facility] will not accept the SRF with the Rx on it and they want [place] to fax a new rx to them at [phone]

## 2013-08-06 ENCOUNTER — Other Ambulatory Visit: Payer: Self-pay | Admitting: Obstetrics and Gynecology

## 2013-08-06 DIAGNOSIS — R921 Mammographic calcification found on diagnostic imaging of breast: Secondary | ICD-10-CM

## 2013-08-25 ENCOUNTER — Ambulatory Visit (INDEPENDENT_AMBULATORY_CARE_PROVIDER_SITE_OTHER): Payer: BC Managed Care – PPO | Admitting: Diagnostic Neuroimaging

## 2013-08-25 ENCOUNTER — Encounter: Payer: Self-pay | Admitting: Diagnostic Neuroimaging

## 2013-08-25 VITALS — BP 153/97 | HR 87 | Temp 98.8°F | Ht 63.0 in | Wt 160.0 lb

## 2013-08-25 DIAGNOSIS — G35 Multiple sclerosis: Secondary | ICD-10-CM

## 2013-08-25 NOTE — Progress Notes (Signed)
[facility]  PATIENT: Ashley Diaz DOB: [birth year]  REFERRING CLINICIAN:  HISTORY FROM: patient REASON FOR VISIT: follow up   HISTORICAL  CHIEF COMPLAINT:  Chief Complaint  Patient presents with  . Follow-up    MS    HISTORY OF PRESENT ILLNESS:   UPDATE 08/25/13: Patient completed PreferMS study and transitioned to [place]. Since last visit, doing well. Tolerating gilenya. No side effects. No new MS symptoms. Numbness is feet resolved, but rarely returns if she does not exercise regularly.  UPDATE 12/02/11: Doing well.  Not been on rebif since Feb 2012.  No flares or attacks.  Now it seems that her prior rxns where food related allergies (egg, dairy).  She is ready to resume MS treatment.  Also, had tried to get pregnant, but now is not planning to proceed with pregnancy.  UPDATE 12/27/10: had 2 episodes of skin flushing and itching.  Now doing well during our office visit.  No breathing problems, tongue or lip swelling.  12/24/10 - rebif injection at night; no events 12/25/10 - in morning, whole body flushed, red, hot, right before shower. lasting 1 hour 12/26/10 - rebif injection at night; no events 12/27/10 - similar event of flushing  UPDATE 06/26/10: Doing well.  On rebif and tolerating injections.  Started on 04/27/10.  Some site rxn in arms.  Back to exercise.  Feels good overall.   PRIOR HPI: 42 year old right-handed female with history of migraine headaches presenting for evaluation of bilateral foot numbness since September 2010.  The patient woke up one morning and felt the sensation of pins and needles the bottom of her feet.  She noticed it particularly in her third, fourth and fifth toes bilaterally. Over the next several weeks she noticed bilateral foot swelling. The numbness progressed up to the level of just below her knees. She was evaluated in urgent care clinic where routine blood work was unremarkable. She had x-rays of the lumbar spine which were  normal. The swelling in her feet gradually subsided and approximately 2-3 weeks ago the patient needles began to subside. Currently she feels a sensation of her feet be swollen even though they are not visibly swollen.  She denies problems in her hands. She denies facial numbness or weakness. She denies visual problems. There is no associated or alleviating factors. When she walks fast, jogs or physically exerts herself her symptoms are sometimes worsened.  REVIEW OF SYSTEMS: Full 14 system review of systems performed and notable only for nothing.  ALLERGIES: Allergies  Allergen Reactions  . Penicillins Hives    HOME MEDICATIONS: Prior to Admission medications   Medication Sig Start Date End Date Taking? Authorizing Provider  EPINEPHrine (EPI-PEN) 0.3 mg/0.3 mL DEVI Inject 0.3 mg into the muscle as needed.   Yes Historical Provider, MD  Fingolimod HCl (GILENYA) 0.5 MG CAPS Take 1 capsule (0.5 mg total) by mouth once. Daily 03/24/13  Yes Penni Bombard, MD  valACYclovir (VALTREX) 500 MG tablet Take 500 mg by mouth 2 (two) times daily.   Yes Historical Provider, MD   Outpatient Prescriptions Prior to Visit  Medication Sig Dispense Refill  . EPINEPHrine (EPI-PEN) 0.3 mg/0.3 mL DEVI Inject 0.3 mg into the muscle as needed.      . Fingolimod HCl (GILENYA) 0.5 MG CAPS Take 1 capsule (0.5 mg total) by mouth once. Daily  30 capsule  11  . valACYclovir (VALTREX) 500 MG tablet Take 500 mg by mouth 2 (two) times daily.  No facility-administered medications prior to visit.    PAST MEDICAL HISTORY: Past Medical History  Diagnosis Date  . Allergy   . Endometriosis   . Multiple sclerosis   . Herpes simplex without mention of complication   . Migraine     PAST SURGICAL HISTORY: Past Surgical History  Procedure Laterality Date  . Cesarean section    . Cryotherapy    . Umbilical hernia repair    . Laparoscopy    . Foot surgery      FAMILY HISTORY: Family History  Problem  Relation Age of Onset  . Diabetes Mother   . Diabetes Father   . Cancer Father     throat, lung with metastases  . Diabetes Sister   . Multiple sclerosis Sister   . Glaucoma    . Colon cancer Paternal Grandfather     SOCIAL HISTORY:  History   Social History  . Marital Status: Married    Spouse Name: Terrance    Number of Children: 0  . Years of Education: N/A   Occupational History  . Musician     [employer]   Social History Main Topics  . Smoking status: Never Smoker   . Smokeless tobacco: Never Used  . Alcohol Use: No     Comment: 1-2 glasses monthly  . Drug Use: No  . Sexual Activity: Yes    Birth Control/ Protection: None   Other Topics Concern  . Not on file   Social History Narrative   Patient lives at home with her spouse.   Caffeine Use:  1 beverage daily     PHYSICAL EXAM  Filed Vitals:   08/25/13 0829  BP: 153/97  Pulse: 87  Temp: 98.8 F (37.1 C)  TempSrc: Oral  Height: 5' 3"  (1.6 m)  Weight: 160 lb (72.576 kg)    Not recorded    Body mass index is 28.35 kg/(m^2).  GENERAL EXAM: Patient is in no distress  CARDIOVASCULAR: Regular rate and rhythm, no murmurs, no carotid bruits  NEUROLOGIC: MENTAL STATUS: awake, alert, language fluent, comprehension intact, naming intact CRANIAL NERVE: pupils equal and reactive to light, visual fields full to confrontation, extraocular muscles intact, no nystagmus, facial sensation and strength symmetric, uvula midline, shoulder shrug symmetric, tongue midline. MOTOR: normal bulk and tone, full strength in the BUE, BLE SENSORY: normal and symmetric to light touch, temperature, vibration COORDINATION: finger-nose-finger, fine finger movements normal REFLEXES: deep tendon reflexes present and symmetric GAIT/STATION: narrow based gait; romberg is negative   DIAGNOSTIC DATA (LABS, IMAGING, TESTING) - I reviewed patient records, labs, notes, testing and imaging myself where available.  Lab  Results  Component Value Date   WBC 14.2* 07/03/2007   HGB 9.0* 07/03/2007   HCT 26.4* 07/03/2007   MCV 90.7 07/03/2007   PLT 73* 07/03/2007      Component Value Date/Time   NA 136 07/03/2007 0530   K 4.1 07/03/2007 0530   CL 102 07/03/2007 0530   CO2 29 07/03/2007 0530   GLUCOSE 88 07/03/2007 0530   BUN 12 DELTA CHECK NOTED 07/03/2007 0530   CREATININE 0.77 07/03/2007 0530   CALCIUM 8.8 07/03/2007 0530   PROT 5.0* 07/03/2007 0530   ALBUMIN 2.1* 07/03/2007 0530   AST 115* 07/03/2007 0530   ALT 174* 07/03/2007 0530   ALKPHOS 100 07/03/2007 0530   BILITOT 1.1 07/03/2007 0530   GFRNONAA >60 07/03/2007 0530   GFRAA  Value: >60        The eGFR has  been calculated using the MDRD equation. This calculation has not been validated in all clinical 07/03/2007 0530   No results found for this basename: CHOL, HDL, LDLCALC, LDLDIRECT, TRIG, CHOLHDL   No results found for this basename: HGBA1C   No results found for this basename: VITAMINB12   No results found for this basename: TSH   12/14/09 MRI brain  1. Multiple chronic demyelinating plaques. 2. No acute demyelinating plaques.  01/04/10 MRI cervical spine - normal 01/04/10 MRI thoracic spine - ill defined spinal cord hyperintensity at T9 segment likely remote age demyelinating plaque.  11/21/10 MRI brain (w/wo)  1. Multiple chronic demyelinating plaques. 2. No acute demyelinating plaques. 3. In comparison to the prior MRI from 12/14/09, there is no interval change.  09/16/13 MRI brain 1. Few periventricular and subcortical chronic demyelinating plaques.  2. No acute plaques. 3. No significant change from MRI on 03/25/12.  01/24/10 Lumbar puncture - WBC 1, RBC 1, IgG index 1.7 (h), Twelve (12) IgG bands were observed in the CSF. No such bands were detected in the serum sample. 01/24/10 labs - ANA, RF, ANCA, HIV, ESR  ASSESSMENT AND PLAN  42 y.o. 42 year old female with paresthesias and swelling of her bilateral feet since Septemeber 2010, which have gradually improved.   Diagnosed with MS by MRI brain, t-spine, labs and LP (12 OCBs).  Was on rebif 04/27/10 until Feb 2012 (stopped b/c of possible rxns and also trying to get pregnant). Now transitioned to [place] and doing well.  PLAN: 1. Continue gilenya 2. Repeat MRI brain w wo and CBC in April 2015  Return in about 6 months (around 02/23/2014) for with Charlott Holler or Eldridge Marcott.    Penni Bombard, MD 44/0/3474, 2:59 AM Certified in Neurology, Neurophysiology and Neuroimaging  [facility] [address], [place], [place] [address] [phone]

## 2013-08-25 NOTE — Patient Instructions (Signed)
Continue gilenya.

## 2013-08-27 ENCOUNTER — Ambulatory Visit
Admission: RE | Admit: 2013-08-27 | Discharge: 2013-08-27 | Disposition: A | Discharge status: Home / Self Care | Payer: BC Managed Care – PPO | Source: Ambulatory Visit | Attending: Obstetrics and Gynecology | Admitting: Obstetrics and Gynecology

## 2013-08-27 DIAGNOSIS — R921 Mammographic calcification found on diagnostic imaging of breast: Secondary | ICD-10-CM

## 2013-10-14 ENCOUNTER — Telehealth: Payer: Self-pay | Admitting: *Deleted

## 2013-10-14 NOTE — Telephone Encounter (Signed)
I called patient. The patient needs another letter similar to one that was done in March of 2013 indicating that she would make an adequate parent in an attempt to adopted child. The patient has multiple sclerosis. I'll leave this letter up to Dr. Leta Baptist.

## 2013-10-14 NOTE — Telephone Encounter (Signed)
Please advise work in Dr., Dr. Leta Baptist is out. Thanks

## 2013-12-27 ENCOUNTER — Other Ambulatory Visit: Payer: Self-pay

## 2013-12-27 MED ORDER — FINGOLIMOD HCL 0.5 MG PO CAPS: 0.5000 mg | ORAL_CAPSULE | Freq: Once | ORAL | Status: DC

## 2014-01-04 ENCOUNTER — Other Ambulatory Visit: Payer: Self-pay | Admitting: Gastroenterology

## 2014-02-01 ENCOUNTER — Other Ambulatory Visit: Payer: Self-pay | Admitting: Obstetrics and Gynecology

## 2014-02-01 DIAGNOSIS — R921 Mammographic calcification found on diagnostic imaging of breast: Secondary | ICD-10-CM

## 2014-02-23 ENCOUNTER — Encounter: Payer: Self-pay | Admitting: Nurse Practitioner

## 2014-02-23 ENCOUNTER — Ambulatory Visit (INDEPENDENT_AMBULATORY_CARE_PROVIDER_SITE_OTHER): Payer: BC Managed Care – PPO | Admitting: Nurse Practitioner

## 2014-02-23 ENCOUNTER — Ambulatory Visit: Payer: BC Managed Care – PPO | Admitting: Nurse Practitioner

## 2014-02-23 VITALS — BP 129/90 | HR 86 | Ht 63.0 in | Wt 153.0 lb

## 2014-02-23 DIAGNOSIS — G35 Multiple sclerosis: Secondary | ICD-10-CM

## 2014-02-23 MED ORDER — FINGOLIMOD HCL 0.5 MG PO CAPS: 0.5000 mg | ORAL_CAPSULE | Freq: Once | ORAL | Status: DC

## 2014-02-23 NOTE — Patient Instructions (Signed)
PLAN:  1. Continue gilenya  2. Repeat MRI brain w wo and CBC in April 2015  Return in about 6 months (around 02/23/2014) for with Charlott Holler or Penumalli.

## 2014-02-23 NOTE — Progress Notes (Signed)
  PATIENT: Ashley Diaz DOB: 08/27/1971  REASON FOR VISIT: routine MS follow up HISTORY FROM: patient  HISTORY OF PRESENT ILLNESS: UPDATE 02/23/14 (LL):Since last visit, doing well. Tolerating gilenya. No side effects. No new MS symptoms. She and her husband were going to try to adopt a child but now have decided it against it due to their age and uncertainty of her MS in the future.  UPDATE 08/25/13: Patient completed Prefer MS study and transitioned to gilenya. Since last visit, doing well. Tolerating gilenya. No side effects. No new MS symptoms. Numbness is feet resolved, but rarely returns if she does not exercise regularly.  UPDATE 12/02/11: Doing well. Not been on rebif since Feb 2012. No flares or attacks. Now it seems that her prior rxns where food related allergies (egg, dairy). She is ready to resume MS treatment. Also, had tried to get pregnant, but now is not planning to proceed with pregnancy.  UPDATE 12/27/10: had 2 episodes of skin flushing and itching. Now doing well during our office visit. No breathing problems, tongue or lip swelling.  12/24/10 - rebif injection at night; no events  12/25/10 - in morning, whole body flushed, red, hot, right before shower. lasting 1 hour  12/26/10 - rebif injection at night; no events  12/27/10 - similar event of flushing  UPDATE 06/26/10: Doing well. On rebif and tolerating injections. Started on 04/27/10. Some site rxn in arms. Back to exercise. Feels good overall.  PRIOR HPI: 43-year-old right-handed female with history of migraine headaches presenting for evaluation of bilateral foot numbness since September 2010.  The patient woke up one morning and felt the sensation of pins and needles the bottom of her feet. She noticed it particularly in her third, fourth and fifth toes bilaterally. Over the next several weeks she noticed bilateral foot swelling. The numbness progressed up to the level of just below her knees. She was evaluated in urgent care  clinic where routine blood work was unremarkable. She had x-rays of the lumbar spine which were normal. The swelling in her feet gradually subsided and approximately 2-3 weeks ago the patient needles began to subside. Currently she feels a sensation of her feet be swollen even though they are not visibly swollen.  She denies problems in her hands. She denies facial numbness or weakness. She denies visual problems. There is no associated or alleviating factors. When she walks fast, jogs or physically exerts herself her symptoms are sometimes worsened.   REVIEW OF SYSTEMS: Full 14 system review of systems performed and notable only for nothing.  ALLERGIES: Allergies  Allergen Reactions  . Penicillins Hives    HOME MEDICATIONS: Outpatient Prescriptions Prior to Visit  Medication Sig Dispense Refill  . EPINEPHrine (EPI-PEN) 0.3 mg/0.3 mL DEVI Inject 0.3 mg into the muscle as needed.      . valACYclovir (VALTREX) 500 MG tablet Take 500 mg by mouth 2 (two) times daily.      . Fingolimod HCl (GILENYA) 0.5 MG CAPS Take 1 capsule (0.5 mg total) by mouth once. Daily  30 capsule  6   No facility-administered medications prior to visit.     PHYSICAL EXAM  Filed Vitals:   02/23/14 0904  BP: 129/90  Pulse: 86  Height: 5' 3" (1.6 m)  Weight: 153 lb (69.4 kg)   Body mass index is 27.11 kg/(m^2).  Generalized: Well developed, in no acute distress  Head: normocephalic and atraumatic. Oropharynx benign  Neck: Supple, no carotid bruits  Cardiac: Regular rate   rhythm, no murmur  Musculoskeletal: No deformity   Neurological examination  MENTAL STATUS: awake, alert, language fluent, comprehension intact, naming intact  CRANIAL NERVE: pupils equal and reactive to light, visual fields full to confrontation, extraocular muscles intact, no nystagmus, facial sensation and strength symmetric, uvula midline, shoulder shrug symmetric, tongue midline.  MOTOR: normal bulk and tone, full strength in the BUE,  BLE  SENSORY: normal and symmetric to light touch, temperature, vibration  COORDINATION: finger-nose-finger, fine finger movements normal  REFLEXES: deep tendon reflexes present and symmetric  GAIT/STATION: narrow based gait; romberg is negative  09/16/13 MRI brain  1. Few periventricular and subcortical chronic demyelinating plaques.  2. No acute plaques.  3. No significant change from MRI on 03/25/12.  01/24/10 Lumbar puncture - WBC 1, RBC 1, IgG index 1.7 (h), Twelve (12) IgG bands were observed in the CSF.  No such bands were detected in the serum sample.  01/24/10 labs - ANA, RF, ANCA, HIV, ESR   ASSESSMENT AND PLAN 43 y.o. 43-year-old female with paresthesias and swelling of her bilateral feet since Septemeber 2010, which have gradually improved. Diagnosed with MS by MRI brain, t-spine, labs and LP (12 OCBs). Was on rebif 04/27/10 until Feb 2012 (stopped b/c of possible rxns and also trying to get pregnant). Now transitioned to gilenya and doing well.   PLAN:  1. Continue gilenya  2. Repeat MRI brain w wo and CBC.  Return in about 6 months (around 02/23/2014) for with   or Penumalli.    Orders Placed This Encounter  Procedures  . MR Brain W Wo Contrast  . CBC with Differential    Meds ordered this encounter  Medications  . hydrochlorothiazide (MICROZIDE) 12.5 MG capsule    Sig: Take 12.5 mg by mouth daily.  . mesalamine (LIALDA) 1.2 G EC tablet    Sig: Take 1.2 g by mouth daily with breakfast.  . Fingolimod HCl (GILENYA) 0.5 MG CAPS    Sig: Take 1 capsule (0.5 mg total) by mouth once. Daily    Dispense:  30 capsule    Refill:  6    Order Specific Question:  Supervising Provider    Answer:  PENUMALLI, VIKRAM R [[id number]]   Return in about 6 months (around 08/25/2014).   E. , MSN, NP-C 02/23/2014, 9:34 AM [facility] [address], [address] [place], [place] [address] [phone]  Note: This document was prepared with digital dictation and possible  smart phrase technology. Any transcriptional errors that result from this process are unintentional.   

## 2014-02-24 LAB — CBC WITH DIFFERENTIAL/PLATELET
Basophils Absolute: 0 10*3/uL (ref 0.0–0.2)
Basos: 0 %
Eos: 5 %
Eosinophils Absolute: 0.2 10*3/uL (ref 0.0–0.4)
HCT: 37.3 % (ref 34.0–46.6)
Hemoglobin: 12.6 g/dL (ref 11.1–15.9)
Lymphocytes Absolute: 0.4 10*3/uL — ABNORMAL LOW (ref 0.7–3.1)
Lymphs: 8 %
MCH: 28 pg (ref 26.6–33.0)
MCHC: 33.8 g/dL (ref 31.5–35.7)
MCV: 83 fL (ref 79–97)
Monocytes Absolute: 0.5 10*3/uL (ref 0.1–0.9)
Monocytes: 9 %
Neutrophils Absolute: 3.7 10*3/uL (ref 1.4–7.0)
Neutrophils Relative %: 78 %
RBC: 4.5 x10E6/uL (ref 3.77–5.28)
RDW: 14 % (ref 12.3–15.4)
WBC: 4.8 10*3/uL (ref 3.4–10.8)

## 2014-03-02 ENCOUNTER — Ambulatory Visit (INDEPENDENT_AMBULATORY_CARE_PROVIDER_SITE_OTHER): Payer: BC Managed Care – PPO

## 2014-03-02 DIAGNOSIS — G35 Multiple sclerosis: Secondary | ICD-10-CM

## 2014-03-02 DIAGNOSIS — G35D Multiple sclerosis, unspecified: Secondary | ICD-10-CM

## 2014-03-02 MED ORDER — GADOPENTETATE DIMEGLUMINE 469.01 MG/ML IV SOLN: 15.0000 mL | Freq: Once | INTRAVENOUS | Status: AC | PRN

## 2014-03-03 ENCOUNTER — Ambulatory Visit
Admission: RE | Admit: 2014-03-03 | Discharge: 2014-03-03 | Disposition: A | Discharge status: Home / Self Care | Payer: Self-pay | Source: Ambulatory Visit | Attending: Obstetrics and Gynecology | Admitting: Obstetrics and Gynecology

## 2014-03-03 DIAGNOSIS — R921 Mammographic calcification found on diagnostic imaging of breast: Secondary | ICD-10-CM

## 2014-03-08 NOTE — Progress Notes (Signed)
Quick Note:  I called and gave her the MRI results. No acute lesions. She is still having sinus issues. Will mail copy to her. ______

## 2014-03-08 NOTE — Progress Notes (Signed)
Quick Note:  I called and relayed the results of MRI to pt. She verbalized understanding. Mailed copy to pt. ______

## 2014-05-06 NOTE — Progress Notes (Signed)
I reviewed note and agree with plan.   Mikhai Bienvenue R. Airon Sahni, MD  Certified in Neurology, Neurophysiology and Neuroimaging  [facility] [address], [address] [place],  [address] [phone]   

## 2014-07-07 ENCOUNTER — Other Ambulatory Visit: Payer: Self-pay | Admitting: Gastroenterology

## 2014-08-25 ENCOUNTER — Ambulatory Visit: Payer: BC Managed Care – PPO | Admitting: Nurse Practitioner

## 2014-08-25 ENCOUNTER — Encounter: Payer: Self-pay | Admitting: Nurse Practitioner

## 2014-08-25 ENCOUNTER — Ambulatory Visit (INDEPENDENT_AMBULATORY_CARE_PROVIDER_SITE_OTHER): Payer: BC Managed Care – PPO | Admitting: Nurse Practitioner

## 2014-08-25 VITALS — BP 120/78 | HR 93 | Temp 98.5°F | Ht 61.0 in | Wt 151.0 lb

## 2014-08-25 DIAGNOSIS — Z5181 Encounter for therapeutic drug level monitoring: Secondary | ICD-10-CM

## 2014-08-25 DIAGNOSIS — G35 Multiple sclerosis: Secondary | ICD-10-CM

## 2014-08-25 NOTE — Progress Notes (Signed)
PATIENT: Ashley Diaz DOB: 1971-09-17  REASON FOR VISIT: routine follow up for MS HISTORY FROM: patient  HISTORY OF PRESENT ILLNESS: UPDATE 08/25/14 (LL): Since last visit, doing well. Tolerating gilenya. No side effects. No new MS symptoms. Has routine labs checked at PCP office every 3-6 months, have asked to fax results here. Has had ongoing abdominal pain, sent to [facility] for workup, thought to be possibly a variant of Crohn's.   UPDATE 02/23/14 (LL):Since last visit, doing well. Tolerating gilenya. No side effects. No new MS symptoms. She and her husband were going to try to adopt a child but now have decided it against it due to their age and uncertainty of her MS in the future.  UPDATE 08/25/13: Patient completed Prefer MS study and transitioned to gilenya. Since last visit, doing well. Tolerating gilenya. No side effects. No new MS symptoms. Numbness is feet resolved, but rarely returns if she does not exercise regularly.  UPDATE 12/02/11: Doing well. Not been on rebif since Feb 2012. No flares or attacks. Now it seems that her prior rxns where food related allergies (egg, dairy). She is ready to resume MS treatment. Also, had tried to get pregnant, but now is not planning to proceed with pregnancy.  UPDATE 12/27/10: had 2 episodes of skin flushing and itching. Now doing well during our office visit. No breathing problems, tongue or lip swelling.  12/24/10 - rebif injection at night; no events  12/25/10 - in morning, whole body flushed, red, hot, right before shower. lasting 1 hour  12/26/10 - rebif injection at night; no events  12/27/10 - similar event of flushing  UPDATE 06/26/10: Doing well. On rebif and tolerating injections. Started on 04/27/10. Some site rxn in arms. Back to exercise. Feels good overall.  PRIOR HPI: 43 year old right-handed female with history of migraine headaches presenting for evaluation of bilateral foot numbness since September 2010.  The patient woke up one morning  and felt the sensation of pins and needles the bottom of her feet. She noticed it particularly in her third, fourth and fifth toes bilaterally. Over the next several weeks she noticed bilateral foot swelling. The numbness progressed up to the level of just below her knees. She was evaluated in urgent care clinic where routine blood work was unremarkable. She had x-rays of the lumbar spine which were normal. The swelling in her feet gradually subsided and approximately 2-3 weeks ago the patient needles began to subside. Currently she feels a sensation of her feet be swollen even though they are not visibly swollen.  She denies problems in her hands. She denies facial numbness or weakness. She denies visual problems. There is no associated or alleviating factors. When she walks fast, jogs or physically exerts herself her symptoms are sometimes worsened.   REVIEW OF SYSTEMS: Full 14 system review of systems performed and notable only for abdominal pain only.  ALLERGIES: Allergies  Allergen Reactions  . Penicillins Hives    HOME MEDICATIONS: Outpatient Prescriptions Prior to Visit  Medication Sig Dispense Refill  . EPINEPHrine (EPI-PEN) 0.3 mg/0.3 mL DEVI Inject 0.3 mg into the muscle as needed.      . Fingolimod HCl (GILENYA) 0.5 MG CAPS Take 1 capsule (0.5 mg total) by mouth once. Daily  30 capsule  6  . hydrochlorothiazide (MICROZIDE) 12.5 MG capsule Take 12.5 mg by mouth daily.      . mesalamine (LIALDA) 1.2 G EC tablet Take 1.2 g by mouth daily with breakfast.      .  valACYclovir (VALTREX) 500 MG tablet Take 500 mg by mouth 2 (two) times daily.       No facility-administered medications prior to visit.    PHYSICAL EXAM Filed Vitals:   08/25/14 0944  BP: 120/78  Pulse: 93  Temp: 98.5 F (36.9 C)  TempSrc: Oral  Height: 5' 1"  (1.549 m)  Weight: 151 lb (68.493 kg)   Body mass index is 28.55 kg/(m^2).  Generalized: Well developed, in no acute distress  Head: normocephalic and  atraumatic. Oropharynx benign  Neck: Supple, no carotid bruits  Cardiac: Regular rate rhythm, no murmur  Musculoskeletal: No deformity   Neurological examination  MENTAL STATUS: awake, alert, language fluent, comprehension intact, naming intact  CRANIAL NERVE: pupils equal and reactive to light, visual fields full to confrontation, extraocular muscles intact, no nystagmus, facial sensation and strength symmetric, uvula midline, shoulder shrug symmetric, tongue midline.  MOTOR: normal bulk and tone, full strength in the BUE, BLE  SENSORY: normal and symmetric to light touch, temperature, vibration  COORDINATION: finger-nose-finger, fine finger movements normal  REFLEXES: deep tendon reflexes present and symmetric  GAIT/STATION: narrow based gait; romberg is negative   09/16/13 MRI brain  1. Few periventricular and subcortical chronic demyelinating plaques.  2. No acute plaques.  3. No significant change from MRI on 03/25/12.   03/02/14 MRI Brain Abnormal MRI scan of the brain showing scattered subcortical and periventricular white matter hyperintense CT is compatible with patient's known diagnosis of multiple sclerosis. No enhancing lesions are noted. There is incidental chronic severe paranasal sinus inflammatory disease noted.  01/24/10 Lumbar puncture - WBC 1, RBC 1, IgG index 1.7 (h), Twelve (12) IgG bands were observed in the CSF.  No such bands were detected in the serum sample.   01/24/10 labs - ANA, RF, ANCA, HIV, ESR    ASSESSMENT: 43 y.o. AA female with paresthesias and swelling of her bilateral feet since Septemeber 2010, which have gradually improved. Diagnosed with MS by MRI brain, t-spine, labs and LP (12 OCBs). Was on rebif 04/27/10 until Feb 2012 (stopped b/c of possible rxns and also trying to get pregnant). Now transitioned to [place] and doing well.   PLAN:  Continue gilenya. Return in about 6 months with Dr. Leta Baptist, sooner as needed.  Rudi Rummage Hildreth Robart, MSN, FNP-BC, A/GNP-C  08/25/2014, 9:47 AM [facility] [address], [place], [place] [address] [phone]  Note: This document was prepared with digital dictation and possible smart phrase technology. Any transcriptional errors that result from this process are unintentional.

## 2014-08-25 NOTE — Patient Instructions (Addendum)
PLAN:  Continue gilenya  Return in about 6 months with Dr. Leta Baptist, sooner as needed.

## 2014-09-05 NOTE — Progress Notes (Signed)
I reviewed note and agree with plan.   VIKRAM R. PENUMALLI, MD  Certified in Neurology, Neurophysiology and Neuroimaging  [facility] [address], [address] [place],  [address] [phone]   

## 2014-09-18 ENCOUNTER — Other Ambulatory Visit: Payer: Self-pay

## 2014-09-18 MED ORDER — FINGOLIMOD HCL 0.5 MG PO CAPS: 0.5000 mg | ORAL_CAPSULE | Freq: Once | ORAL | Status: DC

## 2014-09-26 ENCOUNTER — Encounter: Payer: Self-pay | Admitting: Nurse Practitioner

## 2014-12-27 ENCOUNTER — Telehealth: Payer: Self-pay | Admitting: Diagnostic Neuroimaging

## 2014-12-27 NOTE — Telephone Encounter (Signed)
I contacted [employer] at [phone].  They will be sending the patient a temporary supply of Gilenya while they are working on co-pay assist/alternate funding.  Ref Patient Key: [id number].  I called the patient back.  She is aware.

## 2014-12-27 NOTE — Telephone Encounter (Signed)
Pt needs to get Rx for Fingolimod HCl (GILENYA) 0.5 MG CAPS.  She has new insurance and this needs to go to the [facility] to help pay for it.  Please call and advise.

## 2015-01-20 ENCOUNTER — Other Ambulatory Visit: Payer: Self-pay

## 2015-01-20 MED ORDER — FINGOLIMOD HCL 0.5 MG PO CAPS: 0.5000 mg | ORAL_CAPSULE | Freq: Every day | ORAL | Status: DC

## 2015-01-20 NOTE — Telephone Encounter (Signed)
Rx has been sent  

## 2015-01-20 NOTE — Telephone Encounter (Signed)
Ashley Diaz with [facility] is calling to get a new Rx for Gilenya for patient. Please call and advise. Thank you.

## 2015-02-07 ENCOUNTER — Other Ambulatory Visit: Payer: Self-pay

## 2015-02-07 DIAGNOSIS — Z1231 Encounter for screening mammogram for malignant neoplasm of breast: Secondary | ICD-10-CM

## 2015-02-24 ENCOUNTER — Ambulatory Visit: Payer: BC Managed Care – PPO | Admitting: Diagnostic Neuroimaging

## 2015-03-03 ENCOUNTER — Ambulatory Visit: Payer: Self-pay | Admitting: Diagnostic Neuroimaging

## 2015-03-06 ENCOUNTER — Ambulatory Visit
Admission: RE | Admit: 2015-03-06 | Discharge: 2015-03-06 | Disposition: A | Discharge status: Home / Self Care | Payer: 59 | Source: Ambulatory Visit

## 2015-03-06 DIAGNOSIS — Z1231 Encounter for screening mammogram for malignant neoplasm of breast: Secondary | ICD-10-CM

## 2015-03-09 ENCOUNTER — Ambulatory Visit (INDEPENDENT_AMBULATORY_CARE_PROVIDER_SITE_OTHER): Payer: 59 | Admitting: Diagnostic Neuroimaging

## 2015-03-09 ENCOUNTER — Encounter: Payer: Self-pay | Admitting: Diagnostic Neuroimaging

## 2015-03-09 VITALS — Ht 62.0 in | Wt 155.0 lb

## 2015-03-09 DIAGNOSIS — G35 Multiple sclerosis: Secondary | ICD-10-CM

## 2015-03-09 NOTE — Patient Instructions (Signed)
Continue gilenya.

## 2015-03-09 NOTE — Progress Notes (Signed)
PATIENT: Ashley Diaz DOB: 09-Sep-1971  REASON FOR VISIT: routine follow up for MS HISTORY FROM: patient  HISTORY OF PRESENT ILLNESS:  UPDATE 03/09/15 (VRP): Doing well. No new issues. Tolerating gilenya.   UPDATE 08/25/14 (LL): Since last visit, doing well. Tolerating gilenya. No side effects. No new MS symptoms. Has routine labs checked at PCP office every 3-6 months, have asked to fax results here. Has had ongoing abdominal pain, sent to [facility] for workup, thought to be possibly a variant of Crohn's.   UPDATE 02/23/14 (LL):Since last visit, doing well. Tolerating gilenya. No side effects. No new MS symptoms. She and her husband were going to try to adopt a child but now have decided it against it due to their age and uncertainty of her MS in the future.   UPDATE 08/25/13: Patient completed Prefer MS study and transitioned to gilenya. Since last visit, doing well. Tolerating gilenya. No side effects. No new MS symptoms. Numbness is feet resolved, but rarely returns if she does not exercise regularly.   UPDATE 12/02/11: Doing well. Not been on rebif since Feb 2012. No flares or attacks. Now it seems that her prior rxns where food related allergies (egg, dairy). She is ready to resume MS treatment. Also, had tried to get pregnant, but now is not planning to proceed with pregnancy.   UPDATE 12/27/10: had 2 episodes of skin flushing and itching. Now doing well during our office visit. No breathing problems, tongue or lip swelling.  12/24/10 - rebif injection at night; no events  12/25/10 - in morning, whole body flushed, red, hot, right before shower. lasting 1 hour  12/26/10 - rebif injection at night; no events  12/27/10 - similar event of flushing   UPDATE 06/26/10: Doing well. On rebif and tolerating injections. Started on 04/27/10. Some site rxn in arms. Back to exercise. Feels good overall.   PRIOR HPI: 44 year old right-handed female with history of migraine headaches presenting for  evaluation of bilateral foot numbness since September 2010. The patient woke up one morning and felt the sensation of pins and needles the bottom of her feet. She noticed it particularly in her third, fourth and fifth toes bilaterally. Over the next several weeks she noticed bilateral foot swelling. The numbness progressed up to the level of just below her knees. She was evaluated in urgent care clinic where routine blood work was unremarkable. She had x-rays of the lumbar spine which were normal. The swelling in her feet gradually subsided and approximately 2-3 weeks ago the patient needles began to subside. Currently she feels a sensation of her feet be swollen even though they are not visibly swollen. She denies problems in her hands. She denies facial numbness or weakness. She denies visual problems. There is no associated or alleviating factors. When she walks fast, jogs or physically exerts herself her symptoms are sometimes worsened.   REVIEW OF SYSTEMS: Full 14 system review of systems performed and notable only for nothing.   ALLERGIES: Allergies  Allergen Reactions  . Penicillins Hives    HOME MEDICATIONS: Outpatient Prescriptions Prior to Visit  Medication Sig Dispense Refill  . EPINEPHrine (EPI-PEN) 0.3 mg/0.3 mL DEVI Inject 0.3 mg into the muscle as needed.    . Fingolimod HCl (GILENYA) 0.5 MG CAPS Take 1 capsule (0.5 mg total) by mouth daily. 30 capsule 3  . hydrochlorothiazide (MICROZIDE) 12.5 MG capsule Take 12.5 mg by mouth daily.    . mesalamine (LIALDA) 1.2 G EC tablet Take 1.2  g by mouth daily with breakfast.    . valACYclovir (VALTREX) 500 MG tablet Take 500 mg by mouth 2 (two) times daily.     No facility-administered medications prior to visit.    PHYSICAL EXAM Filed Vitals:   03/09/15 1556  Height: 5' 2"  (1.575 m)  Weight: 155 lb (70.308 kg)   Body mass index is 28.34 kg/(m^2).  GENERAL EXAM: Patient is in no distress; well developed, nourished and groomed;  neck is supple  CARDIOVASCULAR: Regular rate and rhythm, no murmurs, no carotid bruits  NEUROLOGIC: MENTAL STATUS: awake, alert, language fluent, comprehension intact, naming intact, fund of knowledge appropriate CRANIAL NERVE: pupils equal and reactive to light, visual fields full to confrontation, extraocular muscles intact, no nystagmus, facial sensation and strength symmetric, hearing intact, palate elevates symmetrically, uvula midline, shoulder shrug symmetric, tongue midline. MOTOR: normal bulk and tone, full strength in the BUE, BLE SENSORY: normal and symmetric to light touch COORDINATION: finger-nose-finger, fine finger movements normal REFLEXES: deep tendon reflexes present and symmetric GAIT/STATION: narrow based gait; able to walk tandem; romberg is negative   09/16/13 MRI brain  1. Few periventricular and subcortical chronic demyelinating plaques.  2. No acute plaques.  3. No significant change from MRI on 03/25/12.   03/02/14 MRI Brain Abnormal MRI scan of the brain showing scattered subcortical and periventricular white matter hyperintense CT is compatible with patient's known diagnosis of multiple sclerosis. No enhancing lesions are noted. There is incidental chronic severe paranasal sinus inflammatory disease noted.   DIAGNOSTICS/IMAGING:  01/24/10 Lumbar puncture - WBC 1, RBC 1, IgG index 1.7 (h), Twelve (12) IgG bands were observed in the CSF. No such bands were detected in the serum sample.   01/24/10 labs - ANA, RF, ANCA, HIV, ESR   03/02/14 MRI brain - MRI scan of the brain showing scattered subcortical and periventricular white matter hyperintense CT is compatible with patient's known diagnosis of multiple sclerosis. No enhancing lesions are noted. There is incidental chronic severe paranasal sinus inflammatory disease noted.   ASSESSMENT: 44 y.o. female with paresthesias and swelling of her bilateral feet since Septemeber 2010, which have gradually improved. Diagnosed  with multiple sclerosis by MRI brain, t-spine, labs and LP (12 OCBs). Was on rebif 04/27/10 until Feb 2012 (stopped b/c of possible rxns and also was trying to get pregnant). Now transitioned to [place] and doing well.   PLAN:  - continue gilenya; annual CBC and CMP with PCP - continue vitamin D; monitoring per PCP or holistic doctor - repeat MRI in April 2017  Return in about 6 months (around 09/08/2015).   Penni Bombard, MD 1/99/1444, 5:84 PM Certified in Neurology, Neurophysiology and Neuroimaging  [facility] [address], [place], [place] [address] [phone]

## 2015-05-10 ENCOUNTER — Other Ambulatory Visit: Payer: Self-pay

## 2015-05-10 MED ORDER — FINGOLIMOD HCL 0.5 MG PO CAPS: 0.5000 mg | ORAL_CAPSULE | Freq: Every day | ORAL | Status: DC

## 2015-05-11 ENCOUNTER — Telehealth: Payer: Self-pay | Admitting: *Deleted

## 2015-05-11 NOTE — Telephone Encounter (Signed)
Spoke to the pt on the phone and informed her that her paperwork was ready to be picked up up front. She thanked me

## 2015-05-11 NOTE — Telephone Encounter (Signed)
Form,DMV Disability Parking Placard sent to [facility] and Dr Leta Baptist 05/11/15.

## 2015-05-23 ENCOUNTER — Telehealth: Payer: Self-pay | Admitting: Diagnostic Neuroimaging

## 2015-05-23 NOTE — Telephone Encounter (Signed)
Patient called inquiring if [employer] request form for patient support had been faxed. Patient states she brought form in 6/15 or 6/16. Please call and advise. Patient can be reached at [phone].

## 2015-05-23 NOTE — Telephone Encounter (Signed)
Called and spoke with the pt, inquiring about the form, informed her that i did not think that we had this form. Talked with Butch Penny in medical records who stated she did not have the form. She said it was supposed to be submitted to assist with her financial issues and the cost of the medication. The pt asked that if we found the forms to please disregard and to shred them because the [employer] program was working on getting her a credit card set up so she did not have to pay for the medication out of pocket. She also asked that Dr. Leta Baptist call a number that she gave me [phone] so that he could call in her next refill. She informed me that he was having to do this while she was getting the assistance program straightened out. She thanked me for calling   I talked with Butch Penny again and she said that if the form was for financial assistance with medications it would have been faxed to [place] our [employer].  I called Janett Billow and she said that she did have the forms but that they stated that they were oncology, she had a copy and faxed them in, but when I informed her that the pt wanted them shredded, she said she would not pursue it any further

## 2015-05-24 ENCOUNTER — Telehealth: Payer: Self-pay

## 2015-05-24 NOTE — Telephone Encounter (Signed)
I called again.  Spoke with [employer].  She transferred me to the pharmacist, Jae.  She asked for authorization for a 14 day supply plus 3 additional refills.  I gave verbal auth.  They will proceed with order and contact patient to verify shipment.

## 2015-05-24 NOTE — Telephone Encounter (Signed)
I called the [facility] at [phone]  regarding Patient's Rx.  Spoke with Lovey Newcomer (female).  He said the pharmacy dept is not open, asked that we call back after 9am.

## 2015-06-18 IMAGING — MG MM DIGITAL SCREENING BILAT W/ CAD
4 series · 4 of 4 positions shown · non-contrast
Comparison: Previous exam(s).

CLINICAL DATA: Screening.

EXAM:
DIGITAL SCREENING BILATERAL MAMMOGRAM WITH CAD

[R CC]
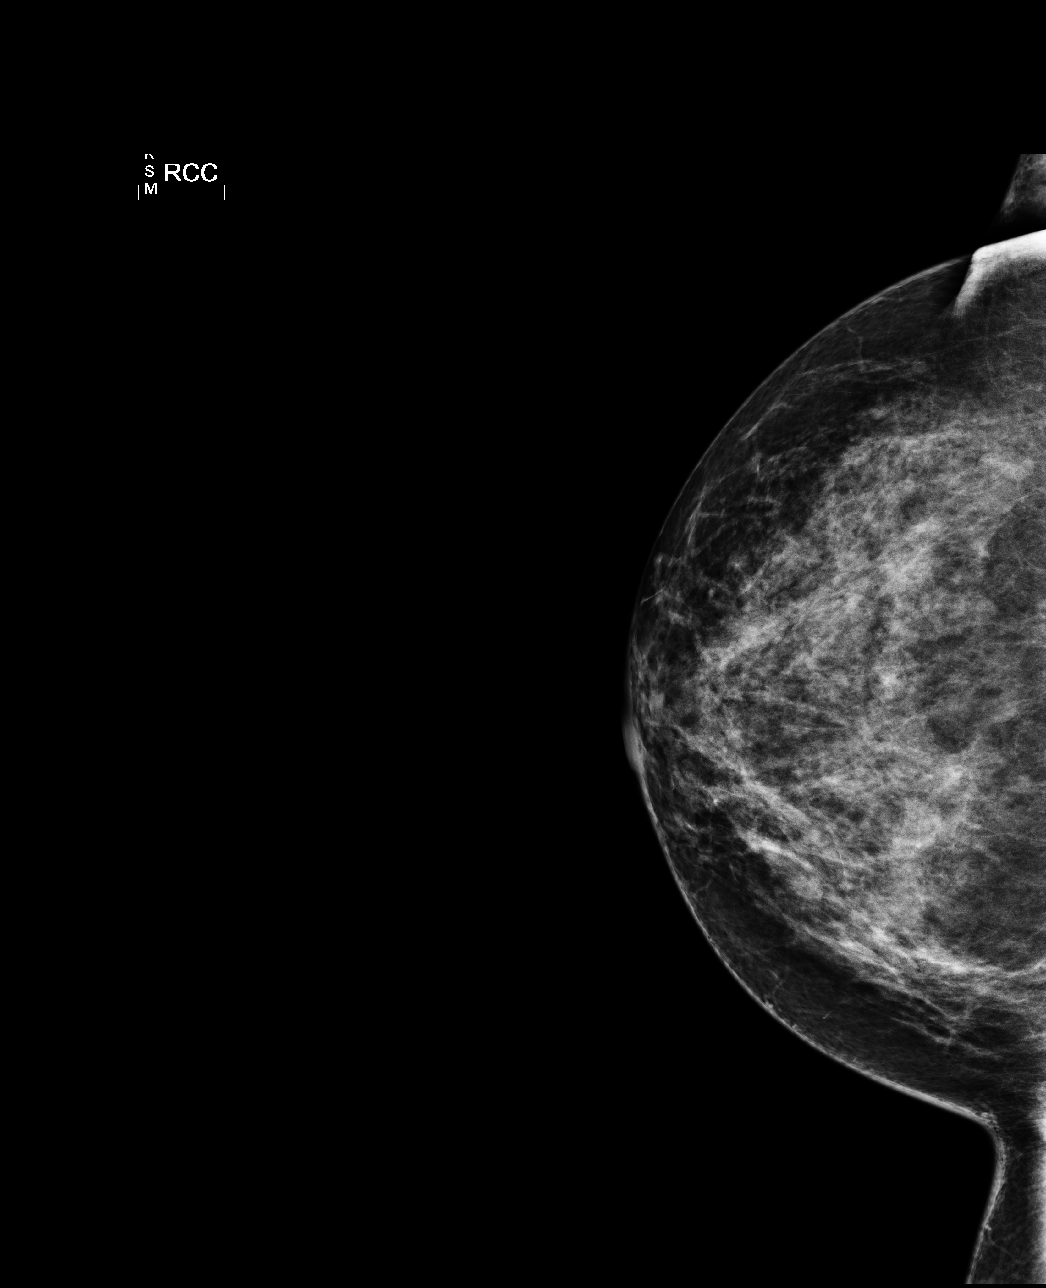

[L CC]
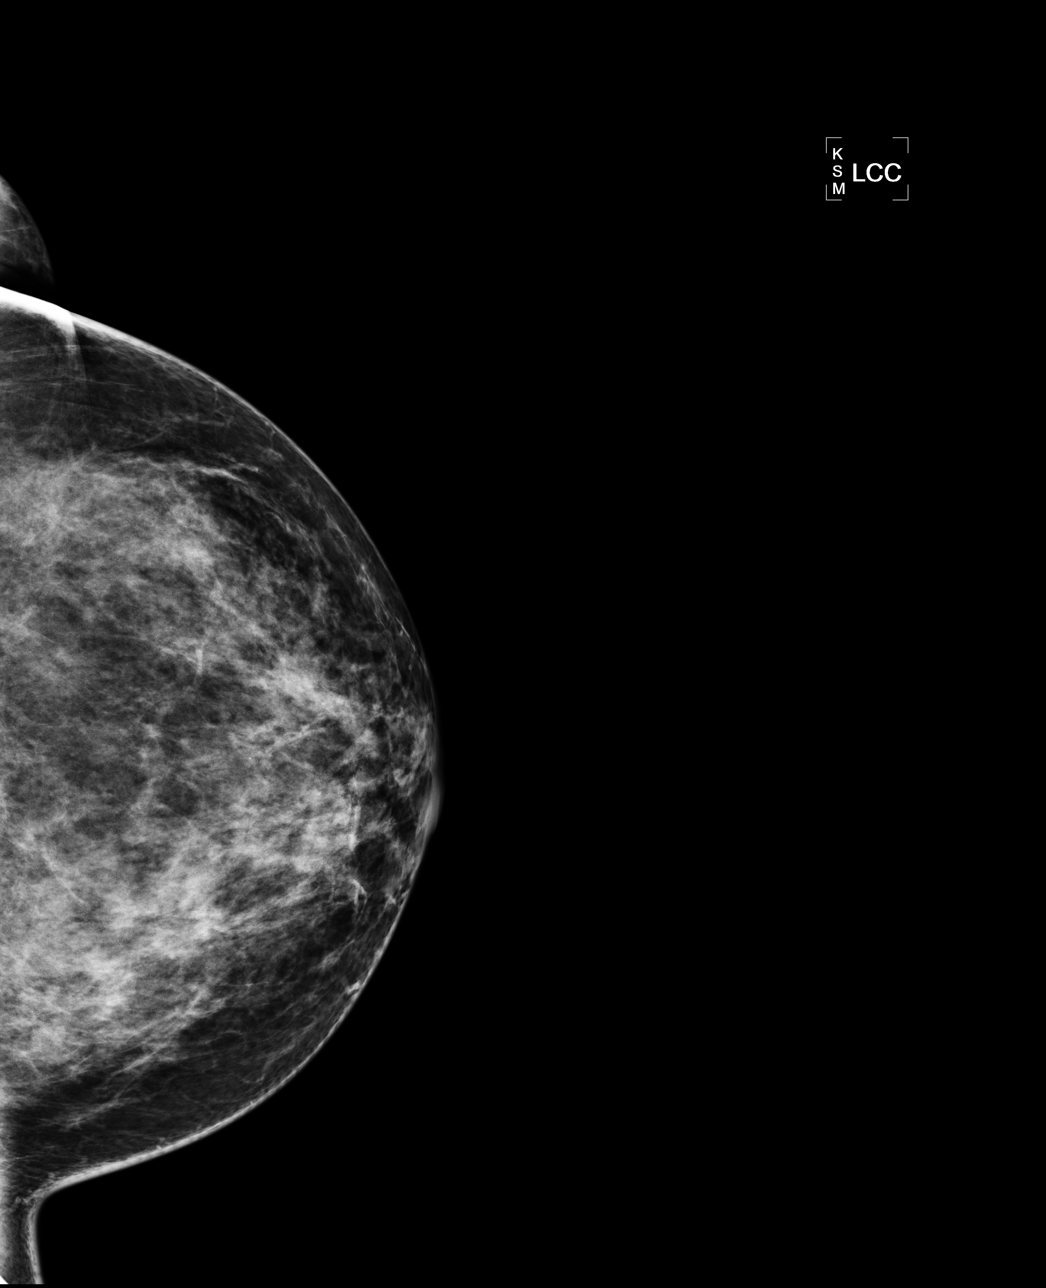

[L MLO]
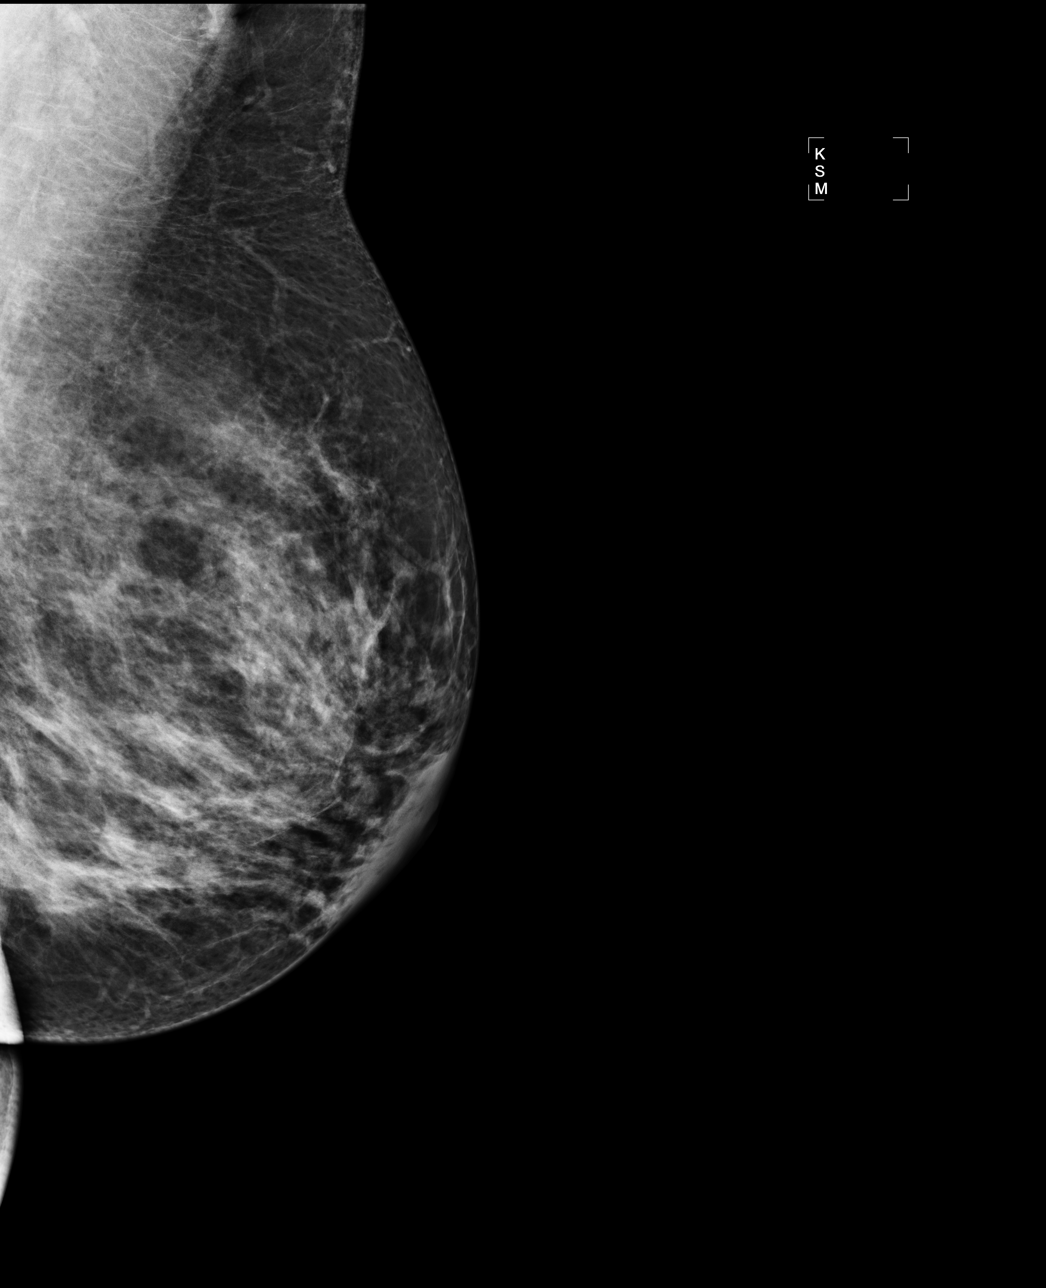

[R MLO]
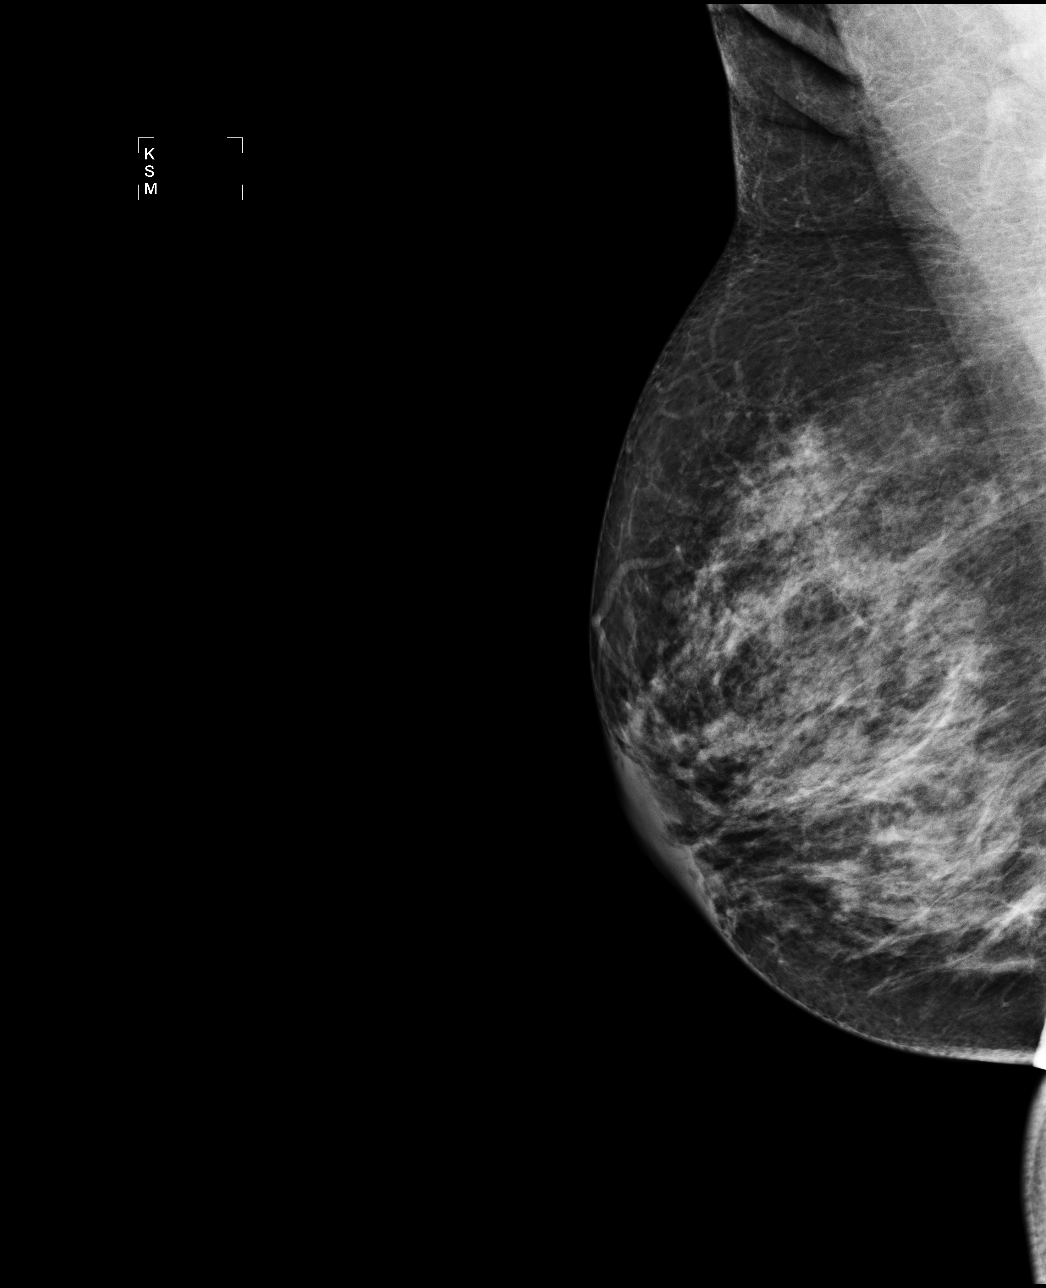

[4 of 4 positions shown; findings below may reference images not displayed]

ACR Breast Density Category c: The breast tissue is heterogeneously
dense, which may obscure small masses.
FINDINGS: There are no findings suspicious for malignancy. Images were
processed with CAD.
IMPRESSION: No mammographic evidence of malignancy. A result letter of this
screening mammogram will be mailed directly to the patient.

RECOMMENDATION:
Screening mammogram in one year. (Code:[id number])

BI-RADS CATEGORY  1: Negative.

## 2015-09-11 ENCOUNTER — Ambulatory Visit (INDEPENDENT_AMBULATORY_CARE_PROVIDER_SITE_OTHER): Payer: 59 | Admitting: Diagnostic Neuroimaging

## 2015-09-11 ENCOUNTER — Encounter: Payer: Self-pay | Admitting: Diagnostic Neuroimaging

## 2015-09-11 VITALS — BP 135/93 | HR 86 | Ht 62.0 in | Wt 149.6 lb

## 2015-09-11 DIAGNOSIS — G35 Multiple sclerosis: Secondary | ICD-10-CM | POA: Diagnosis not present

## 2015-09-11 NOTE — Progress Notes (Signed)
PATIENT: Ashley Diaz DOB: 17-Mar-1971  REASON FOR VISIT: routine follow up for MS HISTORY FROM: patient  Chief Complaint  Patient presents with  . Multiple sclerosis    rm 7  . Follow-up    6 months    HISTORY OF PRESENT ILLNESS:  UPDATE 09/11/15: since last visit, was doing well except now has IBD (? Crohns). Now on prednisone. Some occ cramps in hands. Overall MS stable.  UPDATE 03/09/15 (VRP): Doing well. No new issues. Tolerating gilenya.   UPDATE 08/25/14 (LL): Since last visit, doing well. Tolerating gilenya. No side effects. No new MS symptoms. Has routine labs checked at PCP office every 3-6 months, have asked to fax results here. Has had ongoing abdominal pain, sent to [facility] for workup, thought to be possibly a variant of Crohn's.   UPDATE 02/23/14 (LL):Since last visit, doing well. Tolerating gilenya. No side effects. No new MS symptoms. She and her husband were going to try to adopt a child but now have decided it against it due to their age and uncertainty of her MS in the future.   UPDATE 08/25/13: Patient completed Prefer MS study and transitioned to gilenya. Since last visit, doing well. Tolerating gilenya. No side effects. No new MS symptoms. Numbness is feet resolved, but rarely returns if she does not exercise regularly.   UPDATE 12/02/11: Doing well. Not been on rebif since Feb 2012. No flares or attacks. Now it seems that her prior rxns where food related allergies (egg, dairy). She is ready to resume MS treatment. Also, had tried to get pregnant, but now is not planning to proceed with pregnancy.   UPDATE 12/27/10: had 2 episodes of skin flushing and itching. Now doing well during our office visit. No breathing problems, tongue or lip swelling.  12/24/10 - rebif injection at night; no events  12/25/10 - in morning, whole body flushed, red, hot, right before shower. lasting 1 hour  12/26/10 - rebif injection at night; no events  12/27/10 - similar event of flushing    UPDATE 06/26/10: Doing well. On rebif and tolerating injections. Started on 04/27/10. Some site rxn in arms. Back to exercise. Feels good overall.   PRIOR HPI: 44 year old right-handed female with history of migraine headaches presenting for evaluation of bilateral foot numbness since September 2010. The patient woke up one morning and felt the sensation of pins and needles the bottom of her feet. She noticed it particularly in her third, fourth and fifth toes bilaterally. Over the next several weeks she noticed bilateral foot swelling. The numbness progressed up to the level of just below her knees. She was evaluated in urgent care clinic where routine blood work was unremarkable. She had x-rays of the lumbar spine which were normal. The swelling in her feet gradually subsided and approximately 2-3 weeks ago the patient needles began to subside. Currently she feels a sensation of her feet be swollen even though they are not visibly swollen. She denies problems in her hands. She denies facial numbness or weakness. She denies visual problems. There is no associated or alleviating factors. When she walks fast, jogs or physically exerts herself her symptoms are sometimes worsened.   REVIEW OF SYSTEMS: Full 14 system review of systems performed and notable only for muscle cramps freq waking.   ALLERGIES: Allergies  Allergen Reactions  . Penicillins Hives    HOME MEDICATIONS: Outpatient Prescriptions Prior to Visit  Medication Sig Dispense Refill  . Ascorbic Acid (VITAMIN C) 1000 MG  tablet Take 2,000 mg by mouth.    . cetirizine (ZYRTEC) 10 MG tablet Take 10 mg by mouth.    . Cholecalciferol 5000 UNITS TABS Take by mouth.    . etonogestrel-ethinyl estradiol (NUVARING) 0.12-0.015 MG/24HR vaginal ring Place vaginally.    . ferrous gluconate (FERGON) 324 MG tablet Take 324 mg by mouth.    . Fingolimod HCl (GILENYA) 0.5 MG CAPS Take 1 capsule (0.5 mg total) by mouth daily. 30 capsule 12  .  hydrochlorothiazide (MICROZIDE) 12.5 MG capsule Take 12.5 mg by mouth daily.    . Multiple Vitamins-Minerals (THERA-M) TABS Take 1 tablet by mouth.    . valACYclovir (VALTREX) 500 MG tablet Take 500 mg by mouth 2 (two) times daily.    Marland Kitchen EPINEPHrine (EPI-PEN) 0.3 mg/0.3 mL DEVI Inject 0.3 mg into the muscle as needed.    . mesalamine (LIALDA) 1.2 G EC tablet Take 1.2 g by mouth daily with breakfast.     No facility-administered medications prior to visit.    PHYSICAL EXAM Filed Vitals:   09/11/15 1543  BP: 135/93  Pulse: 86  Height: 5' 2"  (1.575 m)  Weight: 149 lb 9.6 oz (67.858 kg)   Body mass index is 27.36 kg/(m^2).  GENERAL EXAM: Patient is in no distress; well developed, nourished and groomed; neck is supple  CARDIOVASCULAR: Regular rate and rhythm, no murmurs, no carotid bruits  NEUROLOGIC: MENTAL STATUS: awake, alert, language fluent, comprehension intact, naming intact, fund of knowledge appropriate CRANIAL NERVE: pupils equal and reactive to light, visual fields full to confrontation, extraocular muscles intact, no nystagmus, facial sensation and strength symmetric, hearing intact, palate elevates symmetrically, uvula midline, shoulder shrug symmetric, tongue midline. MOTOR: normal bulk and tone, full strength in the BUE, BLE SENSORY: normal and symmetric to light touch COORDINATION: finger-nose-finger, fine finger movements normal REFLEXES: deep tendon reflexes present and symmetric GAIT/STATION: narrow based gait; able to walk tandem; romberg is negative     DIAGNOSTICS/IMAGING:  Lab Results  Component Value Date   WBC 4.8 02/23/2014   HGB 12.6 02/23/2014   HCT 37.3 02/23/2014   MCV 83 02/23/2014   PLT 73* 07/03/2007    01/24/10 Lumbar puncture - WBC 1, RBC 1, IgG index 1.7 (h), Twelve (12) IgG bands were observed in the CSF. No such bands were detected in the serum sample.   01/24/10 labs - ANA, RF, ANCA, HIV, ESR   09/16/13 MRI brain  1. Few periventricular  and subcortical chronic demyelinating plaques.  2. No acute plaques.  3. No significant change from MRI on 03/25/12.   03/02/14 MRI brain - MRI scan of the brain showing scattered subcortical and periventricular white matter hyperintense CT is compatible with patient's known diagnosis of multiple sclerosis. No enhancing lesions are noted. There is incidental chronic severe paranasal sinus inflammatory disease noted.   ASSESSMENT: 44 y.o. female with paresthesias and swelling of her bilateral feet since Septemeber 2010, which have gradually improved. Diagnosed with multiple sclerosis by MRI brain, t-spine, labs and LP (12 OCBs). Was on rebif 04/27/10 until Feb 2012 (stopped b/c of possible rxns and also was trying to get pregnant). Now transitioned to [place] and doing well.   Now with possible inflammatory bowel disease (? Crohn's). May consider tysabri.    PLAN:  - check JCV ab, CBC, CMP - continue gilenya - continue vitamin D - repeat MRI in April 2017  Orders Placed This Encounter  Procedures  . CBC with Differential/Platelet  . Stratify JCV Antibody Test ([facility])  .  Comprehensive metabolic panel   Return in about 3 months (around 12/12/2015).     Penni Bombard, MD 90/30/0923, 3:00 PM Certified in Neurology, Neurophysiology and Neuroimaging  [facility] [address] [place], [place] [address] [phone]

## 2015-09-11 NOTE — Patient Instructions (Signed)
Thank you for coming to see [place] at [facility]. I hope we have been able to provide you high quality care today.  You may receive a patient satisfaction survey over the next few weeks. We would appreciate your feedback and comments so that we may continue to improve ourselves and the health of our patients.  - continue gilenya - may consider tysabri in future   ~~~~~~~~~~~~~~~~~~~~~~~~~~~~~~~~~~~~~~~~~~~~~~~~~~~~~~~~~~~~~~~~~  DR. PENUMALLI'S GUIDE TO HAPPY AND HEALTHY LIVING These are some of my general health and wellness recommendations. Some of them may apply to you better than others. Please use common sense as you try these suggestions and feel free to ask me any questions.   ACTIVITY/FITNESS Mental, social, emotional and physical stimulation are very important for brain and body health. Try learning a new activity (arts, music, language, sports, games).  Keep moving your body to the best of your abilities. You can do this at home, inside or outside, the park, community center, gym or anywhere you like. Consider a physical therapist or personal trainer to get started. Consider the app Sworkit. Fitness trackers such as smart-watches, smart-phones or Fitbits can help as well.   NUTRITION Eat more plants: colorful vegetables, nuts, seeds and berries.  Eat less sugar, salt, preservatives and processed foods.  Avoid toxins such as cigarettes and alcohol.  Drink water when you are thirsty. Warm water with a slice of lemon is an excellent morning drink to start the day.  Consider these websites for more information The Nutrition Source (https://www.henry-hernandez.biz/) Precision Nutrition (WindowBlog.ch)   RELAXATION Consider practicing mindfulness meditation or other relaxation techniques such as deep breathing, prayer, yoga, tai chi, massage. See website mindful.org or the apps Headspace or Calm to help get  started.   SLEEP Try to get at least 7-8+ hours sleep per day. Regular exercise and reduced caffeine will help you sleep better. Practice good sleep hygeine techniques. See website sleep.org for more information.   PLANNING Prepare estate planning, living will, healthcare POA documents. Sometimes this is best planned with the help of an attorney. Theconversationproject.org and agingwithdignity.org are excellent resources.

## 2015-09-12 LAB — COMPREHENSIVE METABOLIC PANEL
ALT: 41 IU/L — ABNORMAL HIGH (ref 0–32)
AST: 15 IU/L (ref 0–40)
Albumin/Globulin Ratio: 2 (ref 1.1–2.5)
Albumin: 4.5 g/dL (ref 3.5–5.5)
Alkaline Phosphatase: 50 IU/L (ref 39–117)
BILIRUBIN TOTAL: 0.5 mg/dL (ref 0.0–1.2)
BUN/Creatinine Ratio: 16 (ref 9–23)
BUN: 13 mg/dL (ref 6–24)
CHLORIDE: 98 mmol/L (ref 97–106)
CO2: 24 mmol/L (ref 18–29)
CREATININE: 0.83 mg/dL (ref 0.57–1.00)
Calcium: 9.6 mg/dL (ref 8.7–10.2)
GFR calc non Af Amer: 87 mL/min/{1.73_m2} (ref >59)
GFR, EST AFRICAN AMERICAN: 101 mL/min/{1.73_m2} (ref >59)
Globulin, Total: 2.3 g/dL (ref 1.5–4.5)
Glucose: 122 mg/dL — ABNORMAL HIGH (ref 65–99)
Potassium: 5.2 mmol/L (ref 3.5–5.2)
Sodium: 138 mmol/L (ref 136–144)
Total Protein: 6.8 g/dL (ref 6.0–8.5)

## 2015-09-12 LAB — CBC WITH DIFFERENTIAL/PLATELET
Basophils Absolute: 0 10*3/uL (ref 0.0–0.2)
Basos: 0 %
EOS (ABSOLUTE): 0 10*3/uL (ref 0.0–0.4)
Eos: 0 %
Hematocrit: 40.6 % (ref 34.0–46.6)
Hemoglobin: 12.7 g/dL (ref 11.1–15.9)
IMMATURE GRANULOCYTES: 0 %
Immature Grans (Abs): 0 10*3/uL (ref 0.0–0.1)
Lymphocytes Absolute: 0.3 10*3/uL — ABNORMAL LOW (ref 0.7–3.1)
Lymphs: 4 %
MCH: 25.9 pg — ABNORMAL LOW (ref 26.6–33.0)
MCHC: 31.3 g/dL — AB (ref 31.5–35.7)
MCV: 83 fL (ref 79–97)
MONOS ABS: 0 10*3/uL — AB (ref 0.1–0.9)
Monocytes: 1 %
NEUTROS PCT: 95 %
Neutrophils Absolute: 5.8 10*3/uL (ref 1.4–7.0)
Platelets: 246 10*3/uL (ref 150–379)
RBC: 4.91 x10E6/uL (ref 3.77–5.28)
RDW: 16.6 % — AB (ref 12.3–15.4)
WBC: 6.1 10*3/uL (ref 3.4–10.8)

## 2015-09-13 ENCOUNTER — Telehealth: Payer: Self-pay | Admitting: *Deleted

## 2015-09-13 DIAGNOSIS — G35 Multiple sclerosis: Secondary | ICD-10-CM

## 2015-09-13 NOTE — Telephone Encounter (Signed)
Per Dr Leta Baptist, spoke with patient and informed her that lab results normal except lymphocytes are trending downward. Advised her this is expected because she is on Gilenya. Advised her that Dr Leta Baptist wants her to have repeat CBC in one month. Gave her lab hours of operation.  She verbalized understanding.

## 2015-09-14 ENCOUNTER — Other Ambulatory Visit: Payer: Self-pay | Admitting: *Deleted

## 2015-09-14 DIAGNOSIS — G35 Multiple sclerosis: Secondary | ICD-10-CM

## 2015-09-14 NOTE — Telephone Encounter (Signed)
CBC with diff lab order entered per Dr Leta Baptist, to be drawn in one month, approximately 10/12/15.

## 2015-09-26 ENCOUNTER — Telehealth: Payer: Self-pay | Admitting: Diagnostic Neuroimaging

## 2015-09-26 NOTE — Telephone Encounter (Signed)
Dr. Lizbeth Bark @ pager [phone] called and would like to speak with you about this patient as she would like to start treatment immediately.  Please call ASAP!

## 2015-09-26 NOTE — Telephone Encounter (Signed)
Dr. Lizbeth Bark [facility]) called to discuss starting remicade for patient's crohn's disease, in combination with gilenya. We spoke about medication options and GI team prefers to use remicade rather than tysabri. After reviewing information and data, I am concerned about potential MS exacerbation as a side effect of remicade (case reports; incidence not known). Dr. Lizbeth Bark will discuss with her colleagues and patient and plan to discuss with me in next few days. Partial colectomy is an option, though not preferred, as well.     Penni Bombard, MD 47/0/9628, 3:66 PM Certified in Neurology, Neurophysiology and [facility] [address], [place], [place] [address] [phone]

## 2015-09-26 NOTE — Telephone Encounter (Signed)
i paged Dr. Lizbeth Bark. Awaiting call back. -VRP

## 2015-09-28 NOTE — Telephone Encounter (Signed)
Dr. Lizbeth Bark called to speak with Dr. Leta Baptist regarding pt plan. She expressed that it was not an emergency but urgent that she speak with Dr. Leta Baptist by the end of the day. May call her cell at [phone].

## 2015-10-02 NOTE — Telephone Encounter (Signed)
Discussed case further. GI will plan to start ustekinumab. I will discuss with patient about option of continuing gilenya vs stopping it during crohn's therapy. -VRP

## 2015-10-03 ENCOUNTER — Telehealth: Payer: Self-pay | Admitting: *Deleted

## 2015-10-03 NOTE — Telephone Encounter (Signed)
I spoke with patient regarding treatment of multiple sclerosis and Crohn's disease. Her GI specialists are planning to start the medication Stelara (ustekinumab) which is safe but ineffective for treatment of multiple sclerosis. However in combination with gilenya, safety profile is unknown. Patient is more concerned about possibility of MS flareups with gilenya discontinuation than the potential unknown risks of combination therapy. Currently her JC virus antibody status is negative. For now patient will continue on Gilenya and the new medication for Crohn's disease (Stelara), and patient understands the potential risks of combination therapy. We'll closely monitor her laboratory and neurologic status going forward.  Penni Bombard, MD 24/12/3534, 1:44 PM Certified in Neurology, Neurophysiology and Neuroimaging  [facility] [address], [place], [place] [address] [phone]

## 2015-10-03 NOTE — Telephone Encounter (Signed)
Spoke with patient and informed her that her JCV antibody test was negative. She inquired if Dr Leta Baptist wants to talk with her about another medication. Advised that since her JCV was negative and her next MRI is due April 2017, he would not make medication change at this time. Will wait to see what MRI shows in April. Confirmed her next FU for Jan 2017 and advised she call before for any concerns, questions. She verbalized understanding.

## 2015-10-11 ENCOUNTER — Telehealth: Payer: Self-pay | Admitting: *Deleted

## 2015-10-11 NOTE — Telephone Encounter (Signed)
Left detailed VM for patient reminding her to come in this week or early next week for repeat labs. Gave her this office lab's hours of operation and instructions for coming in for labs. Left this caller's name, number for questions.

## 2015-10-12 ENCOUNTER — Other Ambulatory Visit (INDEPENDENT_AMBULATORY_CARE_PROVIDER_SITE_OTHER): Payer: Self-pay

## 2015-10-12 DIAGNOSIS — G35 Multiple sclerosis: Secondary | ICD-10-CM

## 2015-10-12 DIAGNOSIS — Z0289 Encounter for other administrative examinations: Secondary | ICD-10-CM

## 2015-10-13 LAB — CBC WITH DIFFERENTIAL/PLATELET
Basophils Absolute: 0 10*3/uL (ref 0.0–0.2)
Basos: 0 %
EOS (ABSOLUTE): 0.1 10*3/uL (ref 0.0–0.4)
Eos: 1 %
HEMOGLOBIN: 11.5 g/dL (ref 11.1–15.9)
Hematocrit: 35.9 % (ref 34.0–46.6)
Immature Grans (Abs): 0 10*3/uL (ref 0.0–0.1)
Immature Granulocytes: 0 %
LYMPHS ABS: 0.5 10*3/uL — AB (ref 0.7–3.1)
Lymphs: 10 %
MCH: 27.3 pg (ref 26.6–33.0)
MCHC: 32 g/dL (ref 31.5–35.7)
MCV: 85 fL (ref 79–97)
MONOCYTES: 5 %
Monocytes Absolute: 0.3 10*3/uL (ref 0.1–0.9)
NEUTROS ABS: 4.2 10*3/uL (ref 1.4–7.0)
Neutrophils: 84 %
Platelets: 202 10*3/uL (ref 150–379)
RBC: 4.22 x10E6/uL (ref 3.77–5.28)
RDW: 17.5 % — ABNORMAL HIGH (ref 12.3–15.4)
WBC: 5 10*3/uL (ref 3.4–10.8)

## 2015-10-25 ENCOUNTER — Telehealth: Payer: Self-pay | Admitting: *Deleted

## 2015-10-25 NOTE — Telephone Encounter (Signed)
Per Dr Leta Baptist, spoke with patient and informed her that her lab results show lymphocyte count is still low but higher than Oct result. Informed her dr will have repeat labs done in 3 months. Reminded her of FU in Jan 2017 and informed her dr may repeat lab that day. Advised that this lab needs to be monitored due to her being on Gilenya. She verbalized understanding, appreciation for call.

## 2015-12-12 ENCOUNTER — Ambulatory Visit (INDEPENDENT_AMBULATORY_CARE_PROVIDER_SITE_OTHER): Payer: 59 | Admitting: Diagnostic Neuroimaging

## 2015-12-12 ENCOUNTER — Encounter: Payer: Self-pay | Admitting: Diagnostic Neuroimaging

## 2015-12-12 VITALS — BP 129/89 | HR 87 | Ht 62.0 in | Wt 163.0 lb

## 2015-12-12 DIAGNOSIS — G35 Multiple sclerosis: Secondary | ICD-10-CM

## 2015-12-12 NOTE — Progress Notes (Signed)
PATIENT: Ashley Diaz DOB: 01-01-1971  REASON FOR VISIT: routine follow up for MS HISTORY FROM: patient  Chief Complaint  Patient presents with  . Multiple sclerosis    rm 7, "knee pain/aches, L>R, x > 1 month, began after completing Prednisone"  . Follow-up    3 month, would like to get repeat labs today instead of Feb    HISTORY OF PRESENT ILLNESS:  UPDATE 12/12/15: Since last visit, now on Stelara (ustekinumab) for crohn's. Continues on gilenya. No MS flares.   UPDATE 09/11/15: since last visit, was doing well except now has IBD (? Crohns). Now on prednisone. Some occ cramps in hands. Overall MS stable.  UPDATE 03/09/15 (VRP): Doing well. No new issues. Tolerating gilenya.   UPDATE 08/25/14 (LL): Since last visit, doing well. Tolerating gilenya. No side effects. No new MS symptoms. Has routine labs checked at PCP office every 3-6 months, have asked to fax results here. Has had ongoing abdominal pain, sent to [facility] for workup, thought to be possibly a variant of Crohn's.   UPDATE 02/23/14 (LL):Since last visit, doing well. Tolerating gilenya. No side effects. No new MS symptoms. She and her husband were going to try to adopt a child but now have decided it against it due to their age and uncertainty of her MS in the future.   UPDATE 08/25/13: Patient completed Prefer MS study and transitioned to gilenya. Since last visit, doing well. Tolerating gilenya. No side effects. No new MS symptoms. Numbness is feet resolved, but rarely returns if she does not exercise regularly.   UPDATE 12/02/11: Doing well. Not been on rebif since Feb 2012. No flares or attacks. Now it seems that her prior rxns where food related allergies (egg, dairy). She is ready to resume MS treatment. Also, had tried to get pregnant, but now is not planning to proceed with pregnancy.   UPDATE 12/27/10: had 2 episodes of skin flushing and itching. Now doing well during our office visit. No breathing problems, tongue  or lip swelling.  12/24/10 - rebif injection at night; no events  12/25/10 - in morning, whole body flushed, red, hot, right before shower. lasting 1 hour  12/26/10 - rebif injection at night; no events  12/27/10 - similar event of flushing   UPDATE 06/26/10: Doing well. On rebif and tolerating injections. Started on 04/27/10. Some site rxn in arms. Back to exercise. Feels good overall.   PRIOR HPI: 45 year old right-handed female with history of migraine headaches presenting for evaluation of bilateral foot numbness since September 2010. The patient woke up one morning and felt the sensation of pins and needles the bottom of her feet. She noticed it particularly in her third, fourth and fifth toes bilaterally. Over the next several weeks she noticed bilateral foot swelling. The numbness progressed up to the level of just below her knees. She was evaluated in urgent care clinic where routine blood work was unremarkable. She had x-rays of the lumbar spine which were normal. The swelling in her feet gradually subsided and approximately 2-3 weeks ago the patient needles began to subside. Currently she feels a sensation of her feet be swollen even though they are not visibly swollen. She denies problems in her hands. She denies facial numbness or weakness. She denies visual problems. There is no associated or alleviating factors. When she walks fast, jogs or physically exerts herself her symptoms are sometimes worsened.   REVIEW OF SYSTEMS: Full 14 system review of systems performed and notable  only for muscle cramps freq waking.   ALLERGIES: Allergies  Allergen Reactions  . Codeine Hives  . Other Hives    Pt unsure Rebif  . Penicillins Hives    HOME MEDICATIONS: Outpatient Prescriptions Prior to Visit  Medication Sig Dispense Refill  . Ascorbic Acid (VITAMIN C) 1000 MG tablet Take 2,000 mg by mouth.    . Cholecalciferol 5000 UNITS TABS Take by mouth.    Marland Kitchen DHEA 25 MG CAPS Take 25 mg by mouth daily.      Marland Kitchen etonogestrel-ethinyl estradiol (NUVARING) 0.12-0.015 MG/24HR vaginal ring Place vaginally.    . ferrous gluconate (FERGON) 324 MG tablet Take 324 mg by mouth.    . Fingolimod HCl (GILENYA) 0.5 MG CAPS Take 1 capsule (0.5 mg total) by mouth daily. 30 capsule 12  . hydrochlorothiazide (MICROZIDE) 12.5 MG capsule Take 12.5 mg by mouth daily.    . Multiple Vitamins-Minerals (THERA-M) TABS Take 1 tablet by mouth.    . valACYclovir (VALTREX) 500 MG tablet Take 500 mg by mouth 2 (two) times daily.    . calcium-vitamin D (OSCAL WITH D) 500-200 MG-UNIT tablet Take 1 tablet by mouth 2 (two) times daily.    . cetirizine (ZYRTEC) 10 MG tablet Take 10 mg by mouth.    . predniSONE (DELTASONE) 10 MG tablet Take taper as prescribed     No facility-administered medications prior to visit.    PHYSICAL EXAM Filed Vitals:   12/12/15 1533  BP: 129/89  Pulse: 87  Height: 5' 2"  (1.575 m)  Weight: 163 lb (73.936 kg)   Body mass index is 29.81 kg/(m^2).  Wt Readings from Last 3 Encounters:  12/12/15 163 lb (73.936 kg)  09/11/15 149 lb 9.6 oz (67.858 kg)  03/09/15 155 lb (70.308 kg)    GENERAL EXAM: Patient is in no distress; well developed, nourished and groomed; neck is supple  CARDIOVASCULAR: Regular rate and rhythm, no murmurs, no carotid bruits  NEUROLOGIC: MENTAL STATUS: awake, alert, language fluent, comprehension intact, naming intact, fund of knowledge appropriate CRANIAL NERVE: pupils equal and reactive to light, visual fields full to confrontation, extraocular muscles intact, no nystagmus, facial sensation and strength symmetric, hearing intact, palate elevates symmetrically, uvula midline, shoulder shrug symmetric, tongue midline. MOTOR: normal bulk and tone, full strength in the BUE, BLE SENSORY: normal and symmetric to light touch COORDINATION: finger-nose-finger, fine finger movements normal REFLEXES: deep tendon reflexes present and symmetric GAIT/STATION: narrow based gait; able  to walk tandem; romberg is negative     DIAGNOSTICS/IMAGING:  Lab Results  Component Value Date   WBC 5.0 10/12/2015   HGB 12.6 02/23/2014   HCT 35.9 10/12/2015   MCV 85 10/12/2015   PLT 202 10/12/2015   LYMPHOCYTES ABSOLUTE  Date Value Ref Range Status  10/12/2015 0.5* 0.7 - 3.1 x10E3/uL Final  09/11/2015 0.3* 0.7 - 3.1 x10E3/uL Final  02/23/2014 0.4* 0.7 - 3.1 x10E3/uL Final    01/24/10 Lumbar puncture - WBC 1, RBC 1, IgG index 1.7 (h), Twelve (12) IgG bands were observed in the CSF. No such bands were detected in the serum sample.   01/24/10 labs - ANA, RF, ANCA, HIV, ESR   09/16/13 MRI brain  1. Few periventricular and subcortical chronic demyelinating plaques.  2. No acute plaques.  3. No significant change from MRI on 03/25/12.   03/02/14 MRI brain - MRI scan of the brain showing scattered subcortical and periventricular white matter hyperintense CT is compatible with patient's known diagnosis of multiple sclerosis. No enhancing lesions  are noted. There is incidental chronic severe paranasal sinus inflammatory disease noted.  09/12/15 JCV ab - negative   ASSESSMENT: 45 y.o. female with paresthesias and swelling of her bilateral feet since Septemeber 2010, which have gradually improved. Diagnosed with multiple sclerosis by MRI brain, t-spine, labs and LP (12 OCBs). Was on rebif 04/27/10 until Feb 2012 (stopped b/c of possible rxns and also was trying to get pregnant). Now on gilenya and doing well.   Now with possible inflammatory bowel disease crohn's dz, now on Stelara (ustekinumab).    PLAN:  - continue gilenya - continue vitamin D - repeat MRI in April 2017  Orders Placed This Encounter  Procedures  . MR Brain W Wo Contrast   Return in about 3 months (around 03/11/2016).     Penni Bombard, MD 5/74/7340, 3:70 PM Certified in Neurology, Neurophysiology and Neuroimaging  [facility] [address] [place], [place] [address] [phone]

## 2015-12-12 NOTE — Patient Instructions (Signed)
Thank you for coming to see [place] at [facility]. I hope we have been able to provide you high quality care today.  You may receive a patient satisfaction survey over the next few weeks. We would appreciate your feedback and comments so that we may continue to improve ourselves and the health of our patients.  - continue gilenya   ~~~~~~~~~~~~~~~~~~~~~~~~~~~~~~~~~~~~~~~~~~~~~~~~~~~~~~~~~~~~~~~~~  DR. PENUMALLI'S GUIDE TO HAPPY AND HEALTHY LIVING These are some of my general health and wellness recommendations. Some of them may apply to you better than others. Please use common sense as you try these suggestions and feel free to ask me any questions.   ACTIVITY/FITNESS Mental, social, emotional and physical stimulation are very important for brain and body health. Try learning a new activity (arts, music, language, sports, games).  Keep moving your body to the best of your abilities. You can do this at home, inside or outside, the park, community center, gym or anywhere you like. Consider a physical therapist or personal trainer to get started. Consider the app Sworkit. Fitness trackers such as smart-watches, smart-phones or Fitbits can help as well.   NUTRITION Eat more plants: colorful vegetables, nuts, seeds and berries.  Eat less sugar, salt, preservatives and processed foods.  Avoid toxins such as cigarettes and alcohol.  Drink water when you are thirsty. Warm water with a slice of lemon is an excellent morning drink to start the day.  Consider these websites for more information The Nutrition Source (https://www.henry-hernandez.biz/) Precision Nutrition (WindowBlog.ch)   RELAXATION Consider practicing mindfulness meditation or other relaxation techniques such as deep breathing, prayer, yoga, tai chi, massage. See website mindful.org or the apps Headspace or Calm to help get started.   SLEEP Try to get at least 7-8+  hours sleep per day. Regular exercise and reduced caffeine will help you sleep better. Practice good sleep hygeine techniques. See website sleep.org for more information.   PLANNING Prepare estate planning, living will, healthcare POA documents. Sometimes this is best planned with the help of an attorney. Theconversationproject.org and agingwithdignity.org are excellent resources.

## 2015-12-21 ENCOUNTER — Telehealth: Payer: Self-pay | Admitting: Diagnostic Neuroimaging

## 2015-12-21 NOTE — Telephone Encounter (Signed)
Patient is calling because her pharmacy [facility] says she needs prior authorization for medication Fingolimod HCl (GILENYA) 0.5 MG CAPS. She states the pharmacy sent a fax on 12-18-15. Thank you.

## 2015-12-21 NOTE — Telephone Encounter (Signed)
Spoke with patient and informed her that insurance company has been called and given  clinical information, PA is under review.  Advised it may take 1-2 days. She stated she would call this office when she hears back from pharmacy. She verbalized understanding, appreciation for call.

## 2015-12-21 NOTE — Telephone Encounter (Signed)
Ins has been contacted and provided with clinical info.  Request is under review Ref # [id number]

## 2015-12-25 NOTE — Telephone Encounter (Signed)
Ins requests additional clinical info be faxed to appeals dept at [phone] Ref # [id number].  Requested info has been faxed

## 2015-12-25 NOTE — Telephone Encounter (Signed)
Spoke with patient who states she has not heard from her pharmacy re: [facility]. Spoke with [place], [place], [phone] who stated insurance denied medication.Crystal gave number to call insurance and resubmit .

## 2015-12-26 ENCOUNTER — Encounter: Payer: Self-pay | Admitting: *Deleted

## 2015-12-26 NOTE — Telephone Encounter (Signed)
Pt called and says that she has been denied. She spoke with a Abigail Butts at [facility] and was told the form was not filled out completely. They can take care of it over the phone and if it is done then the pt will be approved for the medication. Please call [phone] -[facility].  May call pt [phone]

## 2015-12-26 NOTE — Telephone Encounter (Signed)
I called the number provided, which is for [facility]).  Spoke with [employer].  He reviewed the file and determined all info was previously provided, and the request has already been approved effective until 12/25/2020, or until the policy changes or is terminated Ref # [id number].  I called the patient to advise.  She expressed understanding and appreciation.

## 2015-12-27 ENCOUNTER — Ambulatory Visit (INDEPENDENT_AMBULATORY_CARE_PROVIDER_SITE_OTHER): Payer: 59

## 2015-12-27 DIAGNOSIS — G35 Multiple sclerosis: Secondary | ICD-10-CM | POA: Diagnosis not present

## 2015-12-28 MED ORDER — GADOPENTETATE DIMEGLUMINE 469.01 MG/ML IV SOLN: 15.0000 mL | Freq: Once | INTRAVENOUS | Status: DC | PRN

## 2016-02-09 ENCOUNTER — Emergency Department (HOSPITAL_BASED_OUTPATIENT_CLINIC_OR_DEPARTMENT_OTHER)
Admission: EM | Admit: 2016-02-09 | Discharge: 2016-02-09 | Disposition: A | Discharge status: Home / Self Care | Payer: 59 | Attending: Emergency Medicine | Admitting: Emergency Medicine

## 2016-02-09 ENCOUNTER — Encounter (HOSPITAL_BASED_OUTPATIENT_CLINIC_OR_DEPARTMENT_OTHER): Payer: Self-pay | Admitting: *Deleted

## 2016-02-09 DIAGNOSIS — Z8742 Personal history of other diseases of the female genital tract: Secondary | ICD-10-CM | POA: Diagnosis not present

## 2016-02-09 DIAGNOSIS — G35 Multiple sclerosis: Secondary | ICD-10-CM | POA: Diagnosis not present

## 2016-02-09 DIAGNOSIS — Z880 Allergy status to penicillin: Secondary | ICD-10-CM | POA: Diagnosis not present

## 2016-02-09 DIAGNOSIS — M7989 Other specified soft tissue disorders: Secondary | ICD-10-CM | POA: Insufficient documentation

## 2016-02-09 DIAGNOSIS — Z79899 Other long term (current) drug therapy: Secondary | ICD-10-CM | POA: Insufficient documentation

## 2016-02-09 DIAGNOSIS — R0789 Other chest pain: Secondary | ICD-10-CM | POA: Insufficient documentation

## 2016-02-09 DIAGNOSIS — E010 Iodine-deficiency related diffuse (endemic) goiter: Secondary | ICD-10-CM | POA: Diagnosis not present

## 2016-02-09 DIAGNOSIS — Z8619 Personal history of other infectious and parasitic diseases: Secondary | ICD-10-CM | POA: Diagnosis not present

## 2016-02-09 DIAGNOSIS — Z8719 Personal history of other diseases of the digestive system: Secondary | ICD-10-CM | POA: Insufficient documentation

## 2016-02-09 DIAGNOSIS — I10 Essential (primary) hypertension: Secondary | ICD-10-CM | POA: Diagnosis present

## 2016-02-09 HISTORY — DX: Crohn's disease, unspecified, without complications: K50.90

## 2016-02-09 HISTORY — DX: Essential (primary) hypertension: I10

## 2016-02-09 MED ORDER — AMLODIPINE BESYLATE 5 MG PO TABS: 5.0000 mg | ORAL_TABLET | Freq: Every day | ORAL | Status: DC

## 2016-02-09 MED FILL — AMLODIPINE BESYLATE 5 MG TA: 5 | 7 days supply | Qty: 7 | Fill #0

## 2016-02-09 NOTE — Discharge Instructions (Signed)
Hypertension Hypertension, commonly called high blood pressure, is when the force of blood pumping through your arteries is too strong. Your arteries are the blood vessels that carry blood from your heart throughout your body. A blood pressure reading consists of a higher number over a lower number, such as 110/72. The higher number (systolic) is the pressure inside your arteries when your heart pumps. The lower number (diastolic) is the pressure inside your arteries when your heart relaxes. Ideally you want your blood pressure below 120/80. Hypertension forces your heart to work harder to pump blood. Your arteries may become narrow or stiff. Having untreated or uncontrolled hypertension can cause heart attack, stroke, kidney disease, and other problems. RISK FACTORS Some risk factors for high blood pressure are controllable. Others are not.  Risk factors you cannot control include:   Race. You may be at higher risk if you are African American.  Age. Risk increases with age.  Gender. Men are at higher risk than women before age 45 years. After age 65, women are at higher risk than men. Risk factors you can control include:  Not getting enough exercise or physical activity.  Being overweight.  Getting too much fat, sugar, calories, or salt in your diet.  Drinking too much alcohol. SIGNS AND SYMPTOMS Hypertension does not usually cause signs or symptoms. Extremely high blood pressure (hypertensive crisis) may cause headache, anxiety, shortness of breath, and nosebleed. DIAGNOSIS To check if you have hypertension, your health care provider will measure your blood pressure while you are seated, with your arm held at the level of your heart. It should be measured at least twice using the same arm. Certain conditions can cause a difference in blood pressure between your right and left arms. A blood pressure reading that is higher than normal on one occasion does not mean that you need treatment. If  it is not clear whether you have high blood pressure, you may be asked to return on a different day to have your blood pressure checked again. Or, you may be asked to monitor your blood pressure at home for 1 or more weeks. TREATMENT Treating high blood pressure includes making lifestyle changes and possibly taking medicine. Living a healthy lifestyle can help lower high blood pressure. You may need to change some of your habits. Lifestyle changes may include:  Following the DASH diet. This diet is high in fruits, vegetables, and whole grains. It is low in salt, red meat, and added sugars.  Keep your sodium intake below 2,300 mg per day.  Getting at least 30-45 minutes of aerobic exercise at least 4 times per week.  Losing weight if necessary.  Not smoking.  Limiting alcoholic beverages.  Learning ways to reduce stress. Your health care provider may prescribe medicine if lifestyle changes are not enough to get your blood pressure under control, and if one of the following is true:  You are 18-59 years of age and your systolic blood pressure is above 140.  You are 60 years of age or older, and your systolic blood pressure is above 150.  Your diastolic blood pressure is above 90.  You have diabetes, and your systolic blood pressure is over 140 or your diastolic blood pressure is over 90.  You have kidney disease and your blood pressure is above 140/90.  You have heart disease and your blood pressure is above 140/90. Your personal target blood pressure may vary depending on your medical conditions, your age, and other factors. HOME CARE INSTRUCTIONS    Have your blood pressure rechecked as directed by your health care provider.   Take medicines only as directed by your health care provider. Follow the directions carefully. Blood pressure medicines must be taken as prescribed. The medicine does not work as well when you skip doses. Skipping doses also puts you at risk for  problems.  Do not smoke.   Monitor your blood pressure at home as directed by your health care provider. SEEK MEDICAL CARE IF:   You think you are having a reaction to medicines taken.  You have recurrent headaches or feel dizzy.  You have swelling in your ankles.  You have trouble with your vision. SEEK IMMEDIATE MEDICAL CARE IF:  You develop a severe headache or confusion.  You have unusual weakness, numbness, or feel faint.  You have severe chest or abdominal pain.  You vomit repeatedly.  You have trouble breathing. MAKE SURE YOU:   Understand these instructions.  Will watch your condition.  Will get help right away if you are not doing well or get worse.   This information is not intended to replace advice given to you by your health care provider. Make sure you discuss any questions you have with your health care provider.   Document Released: 11/11/2005 Document Revised: 03/28/2015 Document Reviewed: 09/03/2013 Elsevier Interactive Patient Education 2016 Elsevier Inc.  

## 2016-02-09 NOTE — ED Provider Notes (Signed)
CSN: [id number]     Arrival date & time 02/09/16  0737 History   First MD Initiated Contact with Patient 02/09/16 0941     No chief complaint on file.   HPI Patient's had high blood pressure over the last week. States to get a new blood pressure cuff at work and she's been checking it there. Pressures up to 106 systolic and up to 269 diastolic. She's been rather persistently elevated over the last week. She has had an increase in her hydrochlorothiazide from 12.5-25 over last 2 days. No chest pain. No trouble breathing. Does have occasional swelling or legs. She has a Crohn's disease and had been on steroids but finished that up around 3 months ago. She does get occasional headaches with it. No fevers or chills. No cough. She has a follow-up with her primary care doctor in 3 days. No previous history of coronary artery disease. She does have a history of hypertension. She states her blood pressure tends to go high when she sees her doctor. She states that she gets nervous and has been nervous about the blood pressure being elevated when she started this week.   Past Medical History  Diagnosis Date  . Allergy   . Endometriosis   . Multiple sclerosis ([place])   . Herpes simplex without mention of complication   . Migraine   . Hypertension   . Crohn's disease [facility])    Past Surgical History  Procedure Laterality Date  . Cesarean section    . Cryotherapy    . Umbilical hernia repair    . Laparoscopy    . Foot surgery     Family History  Problem Relation Age of Onset  . Diabetes Mother   . Diabetes Father   . Cancer Father     throat, lung with metastases  . Diabetes Sister   . Multiple sclerosis Sister   . Glaucoma    . Colon cancer Paternal Grandfather    Social History  Substance Use Topics  . Smoking status: Never Smoker   . Smokeless tobacco: Never Used  . Alcohol Use: No     Comment: 1-2 glasses monthly   OB History    Gravida Para Term Preterm AB TAB SAB Ectopic Multiple  Living   2 0   2  2        Review of Systems  Constitutional: Negative for appetite change.  Eyes: Negative for visual disturbance.  Respiratory: Positive for chest tightness.   Cardiovascular: Negative for chest pain.  Gastrointestinal: Negative for abdominal pain.  Genitourinary: Negative for dysuria.  Musculoskeletal: Negative for back pain.  Skin: Negative for wound.  Neurological: Positive for headaches. Negative for seizures and weakness.      Allergies  Codeine; Other; and Penicillins  Home Medications   Prior to Admission medications   Medication Sig Start Date End Date Taking? Authorizing Provider  Ascorbic Acid (VITAMIN C) 1000 MG tablet Take 2,000 mg by mouth.   Yes Historical Provider, MD  Cholecalciferol 5000 UNITS TABS Take by mouth.   Yes Historical Provider, MD  etonogestrel-ethinyl estradiol (NUVARING) 0.12-0.015 MG/24HR vaginal ring Place vaginally. 08/02/14  Yes Historical Provider, MD  ferrous gluconate (FERGON) 324 MG tablet Take 324 mg by mouth.   Yes Historical Provider, MD  fexofenadine (ALLEGRA) 30 MG tablet Take 30 mg by mouth daily.   Yes Historical Provider, MD  Fingolimod HCl (GILENYA) 0.5 MG CAPS Take 1 capsule (0.5 mg total) by mouth daily. 05/10/15  Yes Vikram  R Penumalli, MD  hydrochlorothiazide (MICROZIDE) 12.5 MG capsule Take 12.5 mg by mouth daily.   Yes Historical Provider, MD  Multiple Vitamins-Minerals (THERA-M) TABS Take 1 tablet by mouth.   Yes Historical Provider, MD  STELARA 90 MG/ML SOSY injection  11/07/15  Yes Historical Provider, MD  valACYclovir (VALTREX) 500 MG tablet Take 500 mg by mouth 2 (two) times daily.   Yes Historical Provider, MD  amLODipine (NORVASC) 5 MG tablet Take 1 tablet (5 mg total) by mouth daily. 02/09/16   Davonna Belling, MD   BP 147/101 mmHg  Pulse 92  Temp(Src) 99.4 F (37.4 C) (Oral)  Resp 18  Ht 5' 1"  (1.549 m)  Wt 165 lb (74.844 kg)  BMI 31.19 kg/m2  SpO2 100%  LMP 02/09/2016 Physical Exam   Constitutional: She appears well-developed.  HENT:  Head: Atraumatic.  Neck: Thyromegaly present.  Cardiovascular: Normal rate.   Pulmonary/Chest: Effort normal.  Abdominal: Soft.  Musculoskeletal: Normal range of motion. She exhibits no edema.  Neurological: She is alert.  Skin: Skin is warm.    ED Course  Procedures (including critical care time) Labs Review Labs Reviewed - No data to display  Imaging Review No results found. I have personally reviewed and evaluated these images and lab results as part of my medical decision-making.   EKG Interpretation None      MDM   Final diagnoses:  Essential hypertension    Patient with hypertension. Doubt end organ damage. Has been however consistently elevated over last week. Mild elevation here but enough that I think it is worth starting Norvasc 5 mg. She will follow-up with her primary care doctor on Monday as planned.    Davonna Belling, MD 02/09/16 [phone]

## 2016-02-09 NOTE — ED Notes (Signed)
C/o high pressure since she has been checking it at work since 3-10. Pt states she has hx of high BP and has had multiple high readings since 3/10. States she took 2 hctz to total of 25 mg this am.

## 2016-03-28 ENCOUNTER — Encounter: Payer: Self-pay | Admitting: Diagnostic Neuroimaging

## 2016-03-28 ENCOUNTER — Ambulatory Visit (INDEPENDENT_AMBULATORY_CARE_PROVIDER_SITE_OTHER): Payer: 59 | Admitting: Diagnostic Neuroimaging

## 2016-03-28 VITALS — BP 130/80 | HR 84 | Ht 62.0 in | Wt 165.0 lb

## 2016-03-28 DIAGNOSIS — K50119 Crohn's disease of large intestine with unspecified complications: Secondary | ICD-10-CM

## 2016-03-28 DIAGNOSIS — G35 Multiple sclerosis: Secondary | ICD-10-CM

## 2016-03-28 NOTE — Progress Notes (Signed)
PATIENT: Ashley Diaz DOB: [birth year]  REASON FOR VISIT: routine follow up for MS HISTORY FROM: patient  Chief Complaint  Patient presents with  . Multiple Sclerosis    rm 7, Gilenya, 12/2015 MRI brain, 08/2015 JCV -neg, "rash on scalp-received medicated shampoo today"  . Follow-up    3 month    HISTORY OF PRESENT ILLNESS:  UPDATE 03/28/16: Since last visit, doing well with MS and crohn's. Had a skin yeast infection now on topical ketoconazole.  Next colonoscopy is in July 2017.   UPDATE 12/12/15: Since last visit, now on Stelara (ustekinumab) for crohn's. Continues on gilenya. No MS flares.   UPDATE 09/11/15: since last visit, was doing well except now has IBD (? Crohns). Now on prednisone. Some occ cramps in hands. Overall MS stable.  UPDATE 03/09/15 (VRP): Doing well. No new issues. Tolerating gilenya.   UPDATE 08/25/14 (LL): Since last visit, doing well. Tolerating gilenya. No side effects. No new MS symptoms. Has routine labs checked at PCP office every 3-6 months, have asked to fax results here. Has had ongoing abdominal pain, sent to [facility] for workup, thought to be possibly a variant of Crohn's.   UPDATE 02/23/14 (LL):Since last visit, doing well. Tolerating gilenya. No side effects. No new MS symptoms. She and her husband were going to try to adopt a child but now have decided it against it due to their age and uncertainty of her MS in the future.   UPDATE 08/25/13: Patient completed Prefer MS study and transitioned to gilenya. Since last visit, doing well. Tolerating gilenya. No side effects. No new MS symptoms. Numbness is feet resolved, but rarely returns if she does not exercise regularly.   UPDATE 12/02/11: Doing well. Not been on rebif since Feb 2012. No flares or attacks. Now it seems that her prior rxns where food related allergies (egg, dairy). She is ready to resume MS treatment. Also, had tried to get pregnant, but now is not planning to proceed with pregnancy.    UPDATE 12/27/10: had 2 episodes of skin flushing and itching. Now doing well during our office visit. No breathing problems, tongue or lip swelling.  12/24/10 - rebif injection at night; no events  12/25/10 - in morning, whole body flushed, red, hot, right before shower. lasting 1 hour  12/26/10 - rebif injection at night; no events  12/27/10 - similar event of flushing   UPDATE 06/26/10: Doing well. On rebif and tolerating injections. Started on 04/27/10. Some site rxn in arms. Back to exercise. Feels good overall.   PRIOR HPI: 45 year old right-handed female with history of migraine headaches presenting for evaluation of bilateral foot numbness since September 2010. The patient woke up one morning and felt the sensation of pins and needles the bottom of her feet. She noticed it particularly in her third, fourth and fifth toes bilaterally. Over the next several weeks she noticed bilateral foot swelling. The numbness progressed up to the level of just below her knees. She was evaluated in urgent care clinic where routine blood work was unremarkable. She had x-rays of the lumbar spine which were normal. The swelling in her feet gradually subsided and approximately 2-3 weeks ago the patient needles began to subside. Currently she feels a sensation of her feet be swollen even though they are not visibly swollen. She denies problems in her hands. She denies facial numbness or weakness. She denies visual problems. There is no associated or alleviating factors. When she walks fast, jogs or physically  exerts herself her symptoms are sometimes worsened.    REVIEW OF SYSTEMS: Full 14 system review of systems performed and negative except: runny nose allergies.   ALLERGIES: Allergies  Allergen Reactions  . Codeine Hives  . Other Hives    Pt unsure Rebif  . Penicillins Hives    HOME MEDICATIONS: Outpatient Prescriptions Prior to Visit  Medication Sig Dispense Refill  . amLODipine (NORVASC) 5 MG tablet Take 1  tablet (5 mg total) by mouth daily. 7 tablet 0  . Ascorbic Acid (VITAMIN C) 1000 MG tablet Take 2,000 mg by mouth.    . Cholecalciferol 5000 UNITS TABS Take by mouth.    . etonogestrel-ethinyl estradiol (NUVARING) 0.12-0.015 MG/24HR vaginal ring Place vaginally.    . ferrous gluconate (FERGON) 324 MG tablet Take 324 mg by mouth.    . fexofenadine (ALLEGRA) 30 MG tablet Take 30 mg by mouth daily.    . Fingolimod HCl (GILENYA) 0.5 MG CAPS Take 1 capsule (0.5 mg total) by mouth daily. 30 capsule 12  . hydrochlorothiazide (MICROZIDE) 12.5 MG capsule Take 12.5 mg by mouth daily.    . Multiple Vitamins-Minerals (THERA-M) TABS Take 1 tablet by mouth.    . STELARA 90 MG/ML SOSY injection     . valACYclovir (VALTREX) 500 MG tablet Take 500 mg by mouth 2 (two) times daily.     Facility-Administered Medications Prior to Visit  Medication Dose Route Frequency Provider Last Rate Last Dose  . gadopentetate dimeglumine (MAGNEVIST) injection 15 mL  15 mL Intravenous Once PRN Penni Bombard, MD        PHYSICAL EXAM Filed Vitals:   03/28/16 1633  BP: 130/80  Pulse: 84  Height: _0  (1.575 m)  Weight: 165 lb (74.844 kg)   Body mass index is 30.17 kg/(m^2).  Wt Readings from Last 3 Encounters:  03/28/16 165 lb (74.844 kg)  02/09/16 165 lb (74.844 kg)  12/26/15 163 lb (73.936 kg)    GENERAL EXAM: Patient is in no distress; well developed, nourished and groomed; neck is supple  CARDIOVASCULAR: Regular rate and rhythm, no murmurs, no carotid bruits  NEUROLOGIC: MENTAL STATUS: awake, alert, language fluent, comprehension intact, naming intact, fund of knowledge appropriate CRANIAL NERVE: pupils equal and reactive to light, visual fields full to confrontation, extraocular muscles intact, no nystagmus, facial sensation and strength symmetric, hearing intact, palate elevates symmetrically, uvula midline, shoulder shrug symmetric, tongue midline. MOTOR: normal bulk and tone, full strength in the  BUE, BLE SENSORY: normal and symmetric to light touch COORDINATION: finger-nose-finger, fine finger movements normal REFLEXES: deep tendon reflexes present and symmetric GAIT/STATION: narrow based gait; able to walk tandem; romberg is negative     DIAGNOSTICS/IMAGING:  Lab Results  Component Value Date   WBC 5.0 10/12/2015   HGB 12.6 02/23/2014   HCT 35.9 10/12/2015   MCV 85 10/12/2015   PLT 202 10/12/2015   LYMPHOCYTES ABSOLUTE  Date Value Ref Range Status  10/12/2015 0.5* 0.7 - 3.1 x10E3/uL Final  09/11/2015 0.3* 0.7 - 3.1 x10E3/uL Final  02/23/2014 0.4* 0.7 - 3.1 x10E3/uL Final   No results found for: [facility]    01/24/10 Lumbar puncture - WBC 1, RBC 1, IgG index 1.7 (h), Twelve (12) IgG bands were observed in the CSF. No such bands were detected in the serum sample.   01/24/10 labs - ANA, RF, ANCA, HIV, ESR   09/16/13 MRI brain  1. Few periventricular and subcortical chronic demyelinating plaques.  2. No acute plaques.  3. No significant  change from MRI on 03/25/12.   03/02/14 MRI brain - MRI scan of the brain showing scattered subcortical and periventricular white matter hyperintense CT is compatible with patient's known diagnosis of multiple sclerosis. No enhancing lesions are noted. There is incidental chronic severe paranasal sinus inflammatory disease noted.  09/12/15 JCV ab - negative  12/27/15 MRI brain (with and without contrast) demonstrating: 1. Few periventricular and subcortical chronic demyelinating plaques.  2. No acute plaques.   ASSESSMENT: 45 y.o. female with paresthesias and swelling of her bilateral feet since Septemeber 2010, which have gradually improved. Diagnosed with multiple sclerosis by MRI brain, t-spine, labs and LP (12 OCBs). Was on rebif 04/27/10 until Feb 2012 (stopped b/c of possible rxns and also was trying to get pregnant). Now on gilenya and doing well.   Now with possible inflammatory bowel disease crohn's dz, now on Stelara (ustekinumab).     PLAN:  - continue gilenya - continue vitamin D - CBC, CMP per GI clinic at [facility]  Return in about 4 months (around 07/29/2016).     Penni Bombard, MD 07/26/2257, 3:46 PM Certified in Neurology, Neurophysiology and Neuroimaging  [facility] [address], [place], [place] [address] [phone]

## 2016-03-28 NOTE — Patient Instructions (Signed)

## 2016-05-14 ENCOUNTER — Telehealth: Payer: Self-pay | Admitting: Diagnostic Neuroimaging

## 2016-05-14 MED ORDER — FINGOLIMOD HCL 0.5 MG PO CAPS: 0.5000 mg | ORAL_CAPSULE | Freq: Every day | ORAL | Status: DC

## 2016-05-14 NOTE — Telephone Encounter (Signed)
[facility] [phone] Option 1 called to request refill of Gilenya.

## 2016-05-14 NOTE — Telephone Encounter (Signed)
Refilled per e scribe to [facility].

## 2016-07-31 ENCOUNTER — Encounter: Payer: Self-pay | Admitting: Diagnostic Neuroimaging

## 2016-07-31 ENCOUNTER — Ambulatory Visit (INDEPENDENT_AMBULATORY_CARE_PROVIDER_SITE_OTHER): Payer: 59 | Admitting: Diagnostic Neuroimaging

## 2016-07-31 VITALS — BP 117/79 | HR 83 | Wt 173.2 lb

## 2016-07-31 DIAGNOSIS — G35 Multiple sclerosis: Secondary | ICD-10-CM | POA: Diagnosis not present

## 2016-07-31 DIAGNOSIS — Z5181 Encounter for therapeutic drug level monitoring: Secondary | ICD-10-CM | POA: Diagnosis not present

## 2016-07-31 DIAGNOSIS — K50119 Crohn's disease of large intestine with unspecified complications: Secondary | ICD-10-CM | POA: Diagnosis not present

## 2016-07-31 MED ORDER — FINGOLIMOD HCL 0.5 MG PO CAPS: 0.5000 mg | ORAL_CAPSULE | Freq: Every day | ORAL | 4 refills | Status: DC

## 2016-07-31 NOTE — Patient Instructions (Signed)
-   continue gilenya

## 2016-07-31 NOTE — Progress Notes (Signed)
PATIENT: Ashley Diaz DOB: Apr 02, 1971  REASON FOR VISIT: routine follow up for MS HISTORY FROM: patient  Chief Complaint  Patient presents with  . Multiple Sclerosis    rm  6, Gilenya, 12/2015 MRI brain , "doing well"  . Follow-up    4 months    HISTORY OF PRESENT ILLNESS:  UPDATE 07/31/16: Since last visit, doing well. MS and crohn's are stable. Tolerating medications.   UPDATE 03/28/16: Since last visit, doing well with MS and crohn's. Had a skin yeast infection now on topical ketoconazole.  Next colonoscopy is in July 2017.   UPDATE 12/12/15: Since last visit, now on Stelara (ustekinumab) for crohn's. Continues on gilenya. No MS flares.   UPDATE 09/11/15: since last visit, was doing well except now has IBD (? Crohns). Now on prednisone. Some occ cramps in hands. Overall MS stable.  UPDATE 03/09/15 (VRP): Doing well. No new issues. Tolerating gilenya.   UPDATE 08/25/14 (LL): Since last visit, doing well. Tolerating gilenya. No side effects. No new MS symptoms. Has routine labs checked at PCP office every 3-6 months, have asked to fax results here. Has had ongoing abdominal pain, sent to [facility] for workup, thought to be possibly a variant of Crohn's.   UPDATE 02/23/14 (LL):Since last visit, doing well. Tolerating gilenya. No side effects. No new MS symptoms. She and her husband were going to try to adopt a child but now have decided it against it due to their age and uncertainty of her MS in the future.   UPDATE 08/25/13: Patient completed Prefer MS study and transitioned to gilenya. Since last visit, doing well. Tolerating gilenya. No side effects. No new MS symptoms. Numbness is feet resolved, but rarely returns if she does not exercise regularly.   UPDATE 12/02/11: Doing well. Not been on rebif since Feb 2012. No flares or attacks. Now it seems that her prior rxns where food related allergies (egg, dairy). She is ready to resume MS treatment. Also, had tried to get pregnant, but  now is not planning to proceed with pregnancy.   UPDATE 12/27/10: had 2 episodes of skin flushing and itching. Now doing well during our office visit. No breathing problems, tongue or lip swelling.  12/24/10 - rebif injection at night; no events  12/25/10 - in morning, whole body flushed, red, hot, right before shower. lasting 1 hour  12/26/10 - rebif injection at night; no events  12/27/10 - similar event of flushing   UPDATE 06/26/10: Doing well. On rebif and tolerating injections. Started on 04/27/10. Some site rxn in arms. Back to exercise. Feels good overall.   PRIOR HPI: 45 year old right-handed female with history of migraine headaches presenting for evaluation of bilateral foot numbness since September 2010. The patient woke up one morning and felt the sensation of pins and needles the bottom of her feet. She noticed it particularly in her third, fourth and fifth toes bilaterally. Over the next several weeks she noticed bilateral foot swelling. The numbness progressed up to the level of just below her knees. She was evaluated in urgent care clinic where routine blood work was unremarkable. She had x-rays of the lumbar spine which were normal. The swelling in her feet gradually subsided and approximately 2-3 weeks ago the patient needles began to subside. Currently she feels a sensation of her feet be swollen even though they are not visibly swollen. She denies problems in her hands. She denies facial numbness or weakness. She denies visual problems. There is no  associated or alleviating factors. When she walks fast, jogs or physically exerts herself her symptoms are sometimes worsened.    REVIEW OF SYSTEMS: Full 14 system review of systems performed and negative except: runny nose allergies.   ALLERGIES: Allergies  Allergen Reactions  . Codeine Hives  . Other Hives    Pt unsure Rebif  . Penicillins Hives    HOME MEDICATIONS: Outpatient Medications Prior to Visit  Medication Sig Dispense Refill   . amLODipine (NORVASC) 5 MG tablet Take 1 tablet (5 mg total) by mouth daily. 7 tablet 0  . Ascorbic Acid (VITAMIN C) 1000 MG tablet Take 2,000 mg by mouth.    . Cholecalciferol 5000 UNITS TABS Take by mouth.    . etonogestrel-ethinyl estradiol (NUVARING) 0.12-0.015 MG/24HR vaginal ring Place vaginally.    . ferrous gluconate (FERGON) 324 MG tablet Take 324 mg by mouth.    . fexofenadine (ALLEGRA) 30 MG tablet Take 30 mg by mouth daily.    . Fingolimod HCl (GILENYA) 0.5 MG CAPS Take 1 capsule (0.5 mg total) by mouth daily. 30 capsule 3  . hydrochlorothiazide (MICROZIDE) 12.5 MG capsule Take 12.5 mg by mouth daily.    Marland Kitchen ketoconazole (NIZORAL) 2 % shampoo     . Multiple Vitamins-Minerals (THERA-M) TABS Take 1 tablet by mouth.    . STELARA 90 MG/ML SOSY injection     . valACYclovir (VALTREX) 500 MG tablet Take 500 mg by mouth 2 (two) times daily.     Facility-Administered Medications Prior to Visit  Medication Dose Route Frequency Provider Last Rate Last Dose  . gadopentetate dimeglumine (MAGNEVIST) injection 15 mL  15 mL Intravenous Once PRN Penni Bombard, MD        PHYSICAL EXAM Vitals:   07/31/16 1516  BP: 117/79  Pulse: 83  Weight: 173 lb 3.2 oz (78.6 kg)   Body mass index is 31.68 kg/m.  Wt Readings from Last 3 Encounters:  07/31/16 173 lb 3.2 oz (78.6 kg)  03/28/16 165 lb (74.8 kg)  02/09/16 165 lb (74.8 kg)    GENERAL EXAM: Patient is in no distress; well developed, nourished and groomed; neck is supple  CARDIOVASCULAR: Regular rate and rhythm, no murmurs, no carotid bruits  NEUROLOGIC: MENTAL STATUS: awake, alert, language fluent, comprehension intact, naming intact, fund of knowledge appropriate CRANIAL NERVE: pupils equal and reactive to light, visual fields full to confrontation, extraocular muscles intact, no nystagmus, facial sensation and strength symmetric, hearing intact, palate elevates symmetrically, uvula midline, shoulder shrug symmetric, tongue  midline. MOTOR: normal bulk and tone, full strength in the BUE, BLE SENSORY: normal and symmetric to light touch COORDINATION: finger-nose-finger, fine finger movements normal REFLEXES: deep tendon reflexes present and symmetric GAIT/STATION: narrow based gait; able to walk tandem; romberg is negative     DIAGNOSTICS/IMAGING:  Lab Results  Component Value Date   WBC 5.0 10/12/2015   HGB 12.6 02/23/2014   HCT 35.9 10/12/2015   MCV 85 10/12/2015   PLT 202 10/12/2015   Lymphocytes Absolute  Date Value Ref Range Status  10/12/2015 0.5 (L) 0.7 - 3.1 x10E3/uL Final  09/11/2015 0.3 (L) 0.7 - 3.1 x10E3/uL Final  02/23/2014 0.4 (L) 0.7 - 3.1 x10E3/uL Final   No results found for: [facility]    01/24/10 Lumbar puncture - WBC 1, RBC 1, IgG index 1.7 (h), Twelve (12) IgG bands were observed in the CSF. No such bands were detected in the serum sample.   01/24/10 labs - ANA, RF, ANCA, HIV, ESR  09/16/13 MRI brain  1. Few periventricular and subcortical chronic demyelinating plaques.  2. No acute plaques.  3. No significant change from MRI on 03/25/12.   03/02/14 MRI brain - MRI scan of the brain showing scattered subcortical and periventricular white matter hyperintense CT is compatible with patient's known diagnosis of multiple sclerosis. No enhancing lesions are noted. There is incidental chronic severe paranasal sinus inflammatory disease noted.  09/12/15 JCV ab - negative  12/27/15 MRI brain (with and without contrast) demonstrating: 1. Few periventricular and subcortical chronic demyelinating plaques.  2. No acute plaques.    ASSESSMENT: 45 y.o. female with paresthesias and swelling of her bilateral feet since Septemeber 2010, which have gradually improved. Diagnosed with multiple sclerosis by MRI brain, t-spine, labs and LP (12 OCBs). Was on rebif 04/27/10 until Feb 2012 (stopped b/c of possible rxns and also was trying to get pregnant). Now on gilenya and doing well.   Now with possible  inflammatory bowel disease crohn's dz, now on Stelara (ustekinumab; every 2 months pen injection at home).   Dx:  MS (multiple sclerosis) ([place])  Encounter for therapeutic drug monitoring  Crohn's disease of large intestine with complication ([place])     PLAN:  - continue gilenya - continue vitamin D - CBC, CMP per GI clinic at [facility] ordered this encounter  Medications  . Fingolimod HCl (GILENYA) 0.5 MG CAPS    Sig: Take 1 capsule (0.5 mg total) by mouth daily.    Dispense:  90 capsule    Refill:  4   Return in about 4 months (around 11/30/2016).     Penni Bombard, MD 5/0/0164, 2:90 PM Certified in Neurology, Neurophysiology and Neuroimaging  [facility] [address] [place], [place] [address] [phone]

## 2016-08-01 ENCOUNTER — Ambulatory Visit: Payer: 59 | Admitting: Diagnostic Neuroimaging

## 2016-12-03 ENCOUNTER — Ambulatory Visit: Payer: 59 | Admitting: Diagnostic Neuroimaging

## 2017-01-20 ENCOUNTER — Ambulatory Visit (INDEPENDENT_AMBULATORY_CARE_PROVIDER_SITE_OTHER): Payer: 59 | Admitting: Diagnostic Neuroimaging

## 2017-01-20 ENCOUNTER — Encounter: Payer: Self-pay | Admitting: Diagnostic Neuroimaging

## 2017-01-20 VITALS — BP 144/88 | HR 83 | Wt 176.4 lb

## 2017-01-20 DIAGNOSIS — G35 Multiple sclerosis: Secondary | ICD-10-CM

## 2017-01-20 DIAGNOSIS — Z5181 Encounter for therapeutic drug level monitoring: Secondary | ICD-10-CM

## 2017-01-20 DIAGNOSIS — K50119 Crohn's disease of large intestine with unspecified complications: Secondary | ICD-10-CM | POA: Diagnosis not present

## 2017-01-20 NOTE — Progress Notes (Signed)
PATIENT: Ashley Diaz DOB: Jul 12, 1971  REASON FOR VISIT: follow up for MS HISTORY FROM: patient  Chief Complaint  Patient presents with  . Multiple Sclerosis    rm 6, Gilenya, "no new problems or concerns"  . Follow-up    4 month    HISTORY OF PRESENT ILLNESS:  UPDATE 01/21/16: Since last visit, doing well. No new MS sxs. GI illness stable. Tolerating gilenya.   UPDATE 07/31/16: Since last visit, doing well. MS and crohn's are stable. Tolerating medications.   UPDATE 03/28/16: Since last visit, doing well with MS and crohn's. Had a skin yeast infection now on topical ketoconazole.  Next colonoscopy is in July 2017.   UPDATE 12/12/15: Since last visit, now on Stelara (ustekinumab) for crohn's. Continues on gilenya. No MS flares.   UPDATE 09/11/15: since last visit, was doing well except now has IBD (? Crohns). Now on prednisone. Some occ cramps in hands. Overall MS stable.  UPDATE 03/09/15 (VRP): Doing well. No new issues. Tolerating gilenya.   UPDATE 08/25/14 (LL): Since last visit, doing well. Tolerating gilenya. No side effects. No new MS symptoms. Has routine labs checked at PCP office every 3-6 months, have asked to fax results here. Has had ongoing abdominal pain, sent to [facility] for workup, thought to be possibly a variant of Crohn's.   UPDATE 02/23/14 (LL):Since last visit, doing well. Tolerating gilenya. No side effects. No new MS symptoms. She and her husband were going to try to adopt a child but now have decided it against it due to their age and uncertainty of her MS in the future.   UPDATE 08/25/13: Patient completed Prefer MS study and transitioned to gilenya. Since last visit, doing well. Tolerating gilenya. No side effects. No new MS symptoms. Numbness is feet resolved, but rarely returns if she does not exercise regularly.   UPDATE 12/02/11: Doing well. Not been on rebif since Feb 2012. No flares or attacks. Now it seems that her prior rxns where food related  allergies (egg, dairy). She is ready to resume MS treatment. Also, had tried to get pregnant, but now is not planning to proceed with pregnancy.   UPDATE 12/27/10: had 2 episodes of skin flushing and itching. Now doing well during our office visit. No breathing problems, tongue or lip swelling.  12/24/10 - rebif injection at night; no events  12/25/10 - in morning, whole body flushed, red, hot, right before shower. lasting 1 hour  12/26/10 - rebif injection at night; no events  12/27/10 - similar event of flushing   UPDATE 06/26/10: Doing well. On rebif and tolerating injections. Started on 04/27/10. Some site rxn in arms. Back to exercise. Feels good overall.   PRIOR HPI: 46 year old right-handed female with history of migraine headaches presenting for evaluation of bilateral foot numbness since September 2010. The patient woke up one morning and felt the sensation of pins and needles the bottom of her feet. She noticed it particularly in her third, fourth and fifth toes bilaterally. Over the next several weeks she noticed bilateral foot swelling. The numbness progressed up to the level of just below her knees. She was evaluated in urgent care clinic where routine blood work was unremarkable. She had x-rays of the lumbar spine which were normal. The swelling in her feet gradually subsided and approximately 2-3 weeks ago the patient needles began to subside. Currently she feels a sensation of her feet be swollen even though they are not visibly swollen. She denies problems in  her hands. She denies facial numbness or weakness. She denies visual problems. There is no associated or alleviating factors. When she walks fast, jogs or physically exerts herself her symptoms are sometimes worsened.    REVIEW OF SYSTEMS: Full 14 system review of systems performed and negative except: only as per HPI.   ALLERGIES: Allergies  Allergen Reactions  . Codeine Hives  . Other Hives    Pt unsure Rebif  . Penicillins Hives     HOME MEDICATIONS: Outpatient Medications Prior to Visit  Medication Sig Dispense Refill  . amLODipine (NORVASC) 5 MG tablet Take 1 tablet (5 mg total) by mouth daily. 7 tablet 0  . Ascorbic Acid (VITAMIN C) 1000 MG tablet Take 2,000 mg by mouth.    . Cholecalciferol 5000 UNITS TABS Take by mouth.    . etonogestrel-ethinyl estradiol (NUVARING) 0.12-0.015 MG/24HR vaginal ring Place vaginally.    . ferrous gluconate (FERGON) 324 MG tablet Take 324 mg by mouth.    . Fingolimod HCl (GILENYA) 0.5 MG CAPS Take 1 capsule (0.5 mg total) by mouth daily. 90 capsule 4  . hydrochlorothiazide (MICROZIDE) 12.5 MG capsule Take 12.5 mg by mouth daily.    . Multiple Vitamins-Minerals (THERA-M) TABS Take 1 tablet by mouth.    . STELARA 90 MG/ML SOSY injection     . valACYclovir (VALTREX) 500 MG tablet Take 500 mg by mouth 2 (two) times daily.    . fexofenadine (ALLEGRA) 30 MG tablet Take 30 mg by mouth daily.    Marland Kitchen ketoconazole (NIZORAL) 2 % shampoo      Facility-Administered Medications Prior to Visit  Medication Dose Route Frequency Provider Last Rate Last Dose  . gadopentetate dimeglumine (MAGNEVIST) injection 15 mL  15 mL Intravenous Once PRN Penni Bombard, MD        PHYSICAL EXAM Vitals:   01/20/17 1520  BP: (!) 144/88  Pulse: 83  Weight: 176 lb 6.4 oz (80 kg)   Body mass index is 32.26 kg/m.  Wt Readings from Last 3 Encounters:  01/20/17 176 lb 6.4 oz (80 kg)  07/31/16 173 lb 3.2 oz (78.6 kg)  03/28/16 165 lb (74.8 kg)    GENERAL EXAM: Patient is in no distress; well developed, nourished and groomed; neck is supple  CARDIOVASCULAR: Regular rate and rhythm, no murmurs, no carotid bruits  NEUROLOGIC: MENTAL STATUS: awake, alert, language fluent, comprehension intact, naming intact, fund of knowledge appropriate CRANIAL NERVE: pupils equal and reactive to light, visual fields full to confrontation, extraocular muscles intact, no nystagmus, facial sensation and strength  symmetric, hearing intact, palate elevates symmetrically, uvula midline, shoulder shrug symmetric, tongue midline. MOTOR: normal bulk and tone, full strength in the BUE, BLE SENSORY: normal and symmetric to light touch COORDINATION: finger-nose-finger, fine finger movements normal REFLEXES: deep tendon reflexes present and symmetric GAIT/STATION: narrow based gait; able to walk tandem    DIAGNOSTICS/IMAGING:  Lab Results  Component Value Date   WBC 5.0 10/12/2015   HGB 12.6 02/23/2014   HCT 35.9 10/12/2015   MCV 85 10/12/2015   PLT 202 10/12/2015   Lymphocytes Absolute  Date Value Ref Range Status  10/12/2015 0.5 (L) 0.7 - 3.1 x10E3/uL Final  09/11/2015 0.3 (L) 0.7 - 3.1 x10E3/uL Final  02/23/2014 0.4 (L) 0.7 - 3.1 x10E3/uL Final   No results found for: [facility]    01/24/10 Lumbar puncture - WBC 1, RBC 1, IgG index 1.7 (h), Twelve (12) IgG bands were observed in the CSF. No such bands were detected in  the serum sample.   01/24/10 labs - ANA, RF, ANCA, HIV, ESR   09/16/13 MRI brain  1. Few periventricular and subcortical chronic demyelinating plaques.  2. No acute plaques.  3. No significant change from MRI on 03/25/12.   03/02/14 MRI brain - MRI scan of the brain showing scattered subcortical and periventricular white matter hyperintense CT is compatible with patient's known diagnosis of multiple sclerosis. No enhancing lesions are noted. There is incidental chronic severe paranasal sinus inflammatory disease noted.  09/12/15 JCV ab - negative  12/27/15 MRI brain (with and without contrast) demonstrating: 1. Few periventricular and subcortical chronic demyelinating plaques.  2. No acute plaques.   ASSESSMENT: 46 y.o. female with paresthesias and swelling of her bilateral feet since Septemeber 2010, which have gradually improved. Diagnosed with multiple sclerosis by MRI brain, t-spine, labs and LP (12 OCBs). Was on rebif 04/27/10 until Feb 2012 (stopped b/c of possible rxns and also  was trying to get pregnant). Now on gilenya and doing well.   Now with inflammatory bowel disease (crohn's dz), now on Stelara (ustekinumab; every 2 months pen injection at home).   Dx:  MS (multiple sclerosis) ([place]) - Plan: CBC with Differential/Platelet, VITAMIN D 25 Hydroxy (Vit-D Deficiency, Fractures)  Encounter for therapeutic drug monitoring - Plan: CBC with Differential/Platelet, VITAMIN D 25 Hydroxy (Vit-D Deficiency, Fractures)  Crohn's disease of large intestine with complication ([place])    PLAN:  - continue gilenya - continue vitamin D - repeat CBC and Vit D level  Orders Placed This Encounter  Procedures  . CBC with Differential/Platelet  . VITAMIN D 25 Hydroxy (Vit-D Deficiency, Fractures)   Return in about 6 months (around 07/20/2017).     Penni Bombard, MD 8/59/2924, 4:62 PM Certified in Neurology, Neurophysiology and Neuroimaging  [facility] [address] [place], [place] [address] [phone]

## 2017-01-21 LAB — CBC WITH DIFFERENTIAL/PLATELET
Basophils Absolute: 0 10*3/uL (ref 0.0–0.2)
Basos: 0 %
EOS (ABSOLUTE): 0.1 10*3/uL (ref 0.0–0.4)
Eos: 3 %
HEMOGLOBIN: 12.4 g/dL (ref 11.1–15.9)
Hematocrit: 36.5 % (ref 34.0–46.6)
Immature Grans (Abs): 0 10*3/uL (ref 0.0–0.1)
Immature Granulocytes: 0 %
Lymphocytes Absolute: 0.6 10*3/uL — ABNORMAL LOW (ref 0.7–3.1)
Lymphs: 17 %
MCH: 28.6 pg (ref 26.6–33.0)
MCHC: 34 g/dL (ref 31.5–35.7)
MCV: 84 fL (ref 79–97)
MONOS ABS: 0.1 10*3/uL (ref 0.1–0.9)
Monocytes: 3 %
NEUTROS ABS: 2.7 10*3/uL (ref 1.4–7.0)
Neutrophils: 77 %
Platelets: 263 10*3/uL (ref 150–379)
RBC: 4.34 x10E6/uL (ref 3.77–5.28)
RDW: 13.2 % (ref 12.3–15.4)
WBC: 3.6 10*3/uL (ref 3.4–10.8)

## 2017-01-21 LAB — VITAMIN D 25 HYDROXY (VIT D DEFICIENCY, FRACTURES): Vit D, 25-Hydroxy: 39.8 ng/mL (ref 30.0–100.0)

## 2017-01-22 ENCOUNTER — Telehealth: Payer: Self-pay | Admitting: *Deleted

## 2017-01-22 NOTE — Telephone Encounter (Signed)
Per Dr Leta Baptist, spoke with patient and informed her that her labs are unremarkable, no change in her current treatment plan. She verbalized understanding, appreciation.

## 2017-03-10 DIAGNOSIS — Z01419 Encounter for gynecological examination (general) (routine) without abnormal findings: Secondary | ICD-10-CM | POA: Diagnosis not present

## 2017-03-10 DIAGNOSIS — Z124 Encounter for screening for malignant neoplasm of cervix: Secondary | ICD-10-CM | POA: Diagnosis not present

## 2017-05-02 DIAGNOSIS — D509 Iron deficiency anemia, unspecified: Secondary | ICD-10-CM | POA: Diagnosis not present

## 2017-05-02 DIAGNOSIS — Z0000 Encounter for general adult medical examination without abnormal findings: Secondary | ICD-10-CM | POA: Diagnosis not present

## 2017-05-02 DIAGNOSIS — E785 Hyperlipidemia, unspecified: Secondary | ICD-10-CM | POA: Diagnosis not present

## 2017-05-02 DIAGNOSIS — I10 Essential (primary) hypertension: Secondary | ICD-10-CM | POA: Diagnosis not present

## 2017-06-17 DIAGNOSIS — G35 Multiple sclerosis: Secondary | ICD-10-CM | POA: Diagnosis not present

## 2017-06-17 DIAGNOSIS — M722 Plantar fascial fibromatosis: Secondary | ICD-10-CM | POA: Diagnosis not present

## 2017-06-17 DIAGNOSIS — K5010 Crohn's disease of large intestine without complications: Secondary | ICD-10-CM | POA: Diagnosis not present

## 2017-07-29 ENCOUNTER — Ambulatory Visit (INDEPENDENT_AMBULATORY_CARE_PROVIDER_SITE_OTHER): Payer: 59 | Admitting: Diagnostic Neuroimaging

## 2017-07-29 ENCOUNTER — Encounter: Payer: Self-pay | Admitting: Diagnostic Neuroimaging

## 2017-07-29 VITALS — BP 136/86 | HR 87 | Ht 62.0 in | Wt 177.6 lb

## 2017-07-29 DIAGNOSIS — Z5181 Encounter for therapeutic drug level monitoring: Secondary | ICD-10-CM

## 2017-07-29 DIAGNOSIS — G35 Multiple sclerosis: Secondary | ICD-10-CM | POA: Diagnosis not present

## 2017-07-29 NOTE — Progress Notes (Signed)
PATIENT: Ashley Diaz DOB: 1971-08-14  REASON FOR VISIT: follow up for MS HISTORY FROM: patient  Chief Complaint  Patient presents with  . Follow-up  . Multiple Sclerosis    doing well, other then 3 wks ago had worked 25-30 OT, (noted tingling, colitis acting up), Stress? .  Better now.      HISTORY OF PRESENT ILLNESS:  UPDATE (07/29/17, VRP): Since last visit, doing well. Had 1 episode 3 weeks ago of tingling in bilateral hands (lasted 1 day). Was under more stress at work and working more hours. Tolerating gilenya. No alleviating factors.   UPDATE 01/20/17: Since last visit, doing well. No new MS sxs. GI illness stable. Tolerating gilenya.   UPDATE 07/31/16: Since last visit, doing well. MS and crohn's are stable. Tolerating medications.   UPDATE 03/28/16: Since last visit, doing well with MS and crohn's. Had a skin yeast infection now on topical ketoconazole.  Next colonoscopy is in July 2017.   UPDATE 12/12/15: Since last visit, now on Stelara (ustekinumab) for crohn's. Continues on gilenya. No MS flares.   UPDATE 09/11/15: since last visit, was doing well except now has IBD (? Crohns). Now on prednisone. Some occ cramps in hands. Overall MS stable.  UPDATE 03/09/15 (VRP): Doing well. No new issues. Tolerating gilenya.   UPDATE 08/25/14 (LL): Since last visit, doing well. Tolerating gilenya. No side effects. No new MS symptoms. Has routine labs checked at PCP office every 3-6 months, have asked to fax results here. Has had ongoing abdominal pain, sent to [facility] for workup, thought to be possibly a variant of Crohn's.   UPDATE 02/23/14 (LL):Since last visit, doing well. Tolerating gilenya. No side effects. No new MS symptoms. She and her husband were going to try to adopt a child but now have decided it against it due to their age and uncertainty of her MS in the future.   UPDATE 08/25/13: Patient completed Prefer MS study and transitioned to gilenya. Since last visit, doing  well. Tolerating gilenya. No side effects. No new MS symptoms. Numbness is feet resolved, but rarely returns if she does not exercise regularly.   UPDATE 12/02/11: Doing well. Not been on rebif since Feb 2012. No flares or attacks. Now it seems that her prior rxns where food related allergies (egg, dairy). She is ready to resume MS treatment. Also, had tried to get pregnant, but now is not planning to proceed with pregnancy.   UPDATE 12/27/10: had 2 episodes of skin flushing and itching. Now doing well during our office visit. No breathing problems, tongue or lip swelling.  12/24/10 - rebif injection at night; no events  12/25/10 - in morning, whole body flushed, red, hot, right before shower. lasting 1 hour  12/26/10 - rebif injection at night; no events  12/27/10 - similar event of flushing   UPDATE 06/26/10: Doing well. On rebif and tolerating injections. Started on 04/27/10. Some site rxn in arms. Back to exercise. Feels good overall.   PRIOR HPI: 46 year old right-handed female with history of migraine headaches presenting for evaluation of bilateral foot numbness since September 2010. The patient woke up one morning and felt the sensation of pins and needles the bottom of her feet. She noticed it particularly in her third, fourth and fifth toes bilaterally. Over the next several weeks she noticed bilateral foot swelling. The numbness progressed up to the level of just below her knees. She was evaluated in urgent care clinic where routine blood work was unremarkable.  She had x-rays of the lumbar spine which were normal. The swelling in her feet gradually subsided and approximately 2-3 weeks ago the patient needles began to subside. Currently she feels a sensation of her feet be swollen even though they are not visibly swollen. She denies problems in her hands. She denies facial numbness or weakness. She denies visual problems. There is no associated or alleviating factors. When she walks fast, jogs or physically  exerts herself her symptoms are sometimes worsened.    REVIEW OF SYSTEMS: Full 14 system review of systems performed and negative except: only as per HPI.   ALLERGIES: Allergies  Allergen Reactions  . Codeine Hives  . Other Hives    Pt unsure Rebif  . Penicillins Hives    HOME MEDICATIONS: Outpatient Medications Prior to Visit  Medication Sig Dispense Refill  . amLODipine (NORVASC) 5 MG tablet Take 1 tablet (5 mg total) by mouth daily. 7 tablet 0  . Ascorbic Acid (VITAMIN C) 1000 MG tablet Take 2,000 mg by mouth.    . Cholecalciferol 5000 UNITS TABS Take by mouth.    . etonogestrel-ethinyl estradiol (NUVARING) 0.12-0.015 MG/24HR vaginal ring Place vaginally.    . ferrous gluconate (FERGON) 324 MG tablet Take 324 mg by mouth.    . Fingolimod HCl (GILENYA) 0.5 MG CAPS Take 1 capsule (0.5 mg total) by mouth daily. 90 capsule 4  . hydrochlorothiazide (MICROZIDE) 12.5 MG capsule Take 12.5 mg by mouth daily.    . Multiple Vitamins-Minerals (THERA-M) TABS Take 1 tablet by mouth.    Delsa Grana 90 MG/ML SOSY injection Inject 90 mg into the skin. Every 8 wks    . valACYclovir (VALTREX) 500 MG tablet Take 500 mg by mouth 2 (two) times daily.    . fexofenadine (ALLEGRA) 30 MG tablet Take 30 mg by mouth daily.    Marland Kitchen ketoconazole (NIZORAL) 2 % shampoo      Facility-Administered Medications Prior to Visit  Medication Dose Route Frequency Provider Last Rate Last Dose  . gadopentetate dimeglumine (MAGNEVIST) injection 15 mL  15 mL Intravenous Once PRN Vaneta Hammontree R, MD        PHYSICAL EXAM Vitals:   07/29/17 1511  BP: 136/86  Pulse: 87  Weight: 177 lb 9.6 oz (80.6 kg)  Height: _0  (1.575 m)   Body mass index is 32.48 kg/m.  Wt Readings from Last 3 Encounters:  07/29/17 177 lb 9.6 oz (80.6 kg)  01/20/17 176 lb 6.4 oz (80 kg)  07/31/16 173 lb 3.2 oz (78.6 kg)    GENERAL EXAM: Patient is in no distress; well developed, nourished and groomed; neck is  supple  CARDIOVASCULAR: Regular rate and rhythm, no murmurs, no carotid bruits  NEUROLOGIC: MENTAL STATUS: awake, alert, language fluent, comprehension intact, naming intact, fund of knowledge appropriate CRANIAL NERVE: pupils equal and reactive to light, visual fields full to confrontation, extraocular muscles intact, no nystagmus, facial sensation and strength symmetric, hearing intact, palate elevates symmetrically, uvula midline, shoulder shrug symmetric, tongue midline. MOTOR: normal bulk and tone, full strength in the BUE, BLE SENSORY: normal and symmetric to light touch COORDINATION: finger-nose-finger, fine finger movements normal REFLEXES: deep tendon reflexes present and symmetric GAIT/STATION: narrow based gait; able to walk tandem    DIAGNOSTICS/IMAGING:  Lab Results  Component Value Date   WBC 3.6 01/20/2017   HGB 12.4 01/20/2017   HCT 36.5 01/20/2017   MCV 84 01/20/2017   PLT 263 01/20/2017   Lymphocytes Absolute  Date Value Ref Range Status  01/20/2017 0.6 (L) 0.7 - 3.1 x10E3/uL Final  10/12/2015 0.5 (L) 0.7 - 3.1 x10E3/uL Final  09/11/2015 0.3 (L) 0.7 - 3.1 x10E3/uL Final   Vit D, 25-Hydroxy  Date Value Ref Range Status  01/20/2017 39.8 30.0 - 100.0 ng/mL Final    Comment:    Vitamin D deficiency has been defined by the [place] practice guideline as a level of serum 25-OH vitamin D less than 20 ng/mL (1,2). The Endocrine Society went on to further define vitamin D insufficiency as a level between 21 and 29 ng/mL (2). 1. IOM (Institute of Medicine). 2010. Dietary reference    intakes for calcium and D. [place]: The    [employer]. 2. Holick MF, Binkley [place], Bischoff-Ferrari HA, et al.    Evaluation, treatment, and prevention of vitamin D    deficiency: an Endocrine Society clinical practice    guideline. JCEM. 2011 Jul; 96(7):1911-30.     06/17/17 WBC 4.4 (L) 4.5 - 11.0 10*9/L [facility]  RBC 4.56 4.00 - 5.20 10*12/L [facility]  HGB 13.2 12.0 - 16.0 g/dL [facility]  HCT 39.9 36.0 - 46.0 % [facility]  MCV 87.4 80.0 - 100.0 fL [facility]  MCH 29.0 26.0 - 34.0 pg [facility]  MCHC 33.2 31.0 - 37.0 g/dL [facility]  RDW 13.1 12.0 - 15.0 % [facility]  MPV 7.4 7.0 - 10.0 fL [facility]  Platelet 305 150 - 440 10*9/L [facility]     01/24/10 Lumbar puncture - WBC 1, RBC 1, IgG index 1.7 (h), Twelve (12) IgG bands were observed in the CSF. No such bands were detected in the serum sample.   01/24/10 labs - ANA, RF, ANCA, HIV, ESR   09/16/13 MRI brain  1. Few periventricular and subcortical chronic demyelinating plaques.  2. No acute plaques.  3. No significant change from MRI on 03/25/12.   03/02/14 MRI brain - MRI scan of the brain showing scattered subcortical and periventricular white matter hyperintense CT is compatible with patient's known diagnosis of multiple sclerosis. No enhancing lesions are noted. There is incidental chronic severe paranasal sinus inflammatory disease noted.  09/12/15 JCV ab - negative  12/27/15 MRI brain (with and without contrast) demonstrating: 1. Few periventricular and subcortical chronic demyelinating plaques.  2. No acute plaques.   ASSESSMENT: 46 y.o. female with paresthesias and swelling of her bilateral feet since Septemeber 2010, which have gradually improved. Diagnosed with multiple sclerosis by MRI brain, t-spine, labs and LP (12 OCBs). Was on rebif 04/27/10 until Feb 2012 (stopped b/c of possible rxns and also was trying to get pregnant). Now on gilenya and doing well.  Now with inflammatory bowel disease (crohn's dz), now on Stelara (ustekinumab; every 2 months pen injection at home).   Also with 1  episode of numbness in hands 3 weeks ago. Will eval MRI brain for MS flare or progression.   Dx:  MS (multiple sclerosis) ([place])  Encounter for therapeutic drug monitoring    PLAN:   MULTIPLE SCLEROSIS (new episode of numbness in hands) - continue gilenya - continue vitamin D - check MRI brain   Orders Placed This Encounter  Procedures  . MR BRAIN W WO CONTRAST   Return in about 6 months (around 01/26/2018).     Penni Bombard, MD 02/24/7061, 3:76 PM Certified in  Neurology, Neurophysiology and Neuroimaging  [facility] [address] [place], [place] [address] [phone]

## 2017-08-04 ENCOUNTER — Other Ambulatory Visit: Payer: Self-pay | Admitting: Diagnostic Neuroimaging

## 2017-08-06 ENCOUNTER — Ambulatory Visit (INDEPENDENT_AMBULATORY_CARE_PROVIDER_SITE_OTHER): Payer: 59

## 2017-08-06 DIAGNOSIS — G35 Multiple sclerosis: Secondary | ICD-10-CM

## 2017-08-06 MED ORDER — GADOPENTETATE DIMEGLUMINE 469.01 MG/ML IV SOLN: 20.0000 mL | Freq: Once | INTRAVENOUS | Status: DC | PRN

## 2017-08-18 ENCOUNTER — Telehealth: Payer: Self-pay | Admitting: *Deleted

## 2017-08-18 NOTE — Telephone Encounter (Signed)
Called and spoke with patient about stable imaging results, continue current plan per Dr Leta Baptist. Pt verbalized understanding. She will call back if she has further questions/concerns.

## 2017-08-18 NOTE — Telephone Encounter (Signed)
-----   Message from Penni Bombard, MD sent at 08/15/2017 12:37 PM EDT ----- Stable imaging results. Please call patient. Continue current plan. -VRP

## 2017-08-20 DIAGNOSIS — M542 Cervicalgia: Secondary | ICD-10-CM | POA: Diagnosis not present

## 2017-08-20 DIAGNOSIS — R6884 Jaw pain: Secondary | ICD-10-CM | POA: Diagnosis not present

## 2017-08-20 DIAGNOSIS — R51 Headache: Secondary | ICD-10-CM | POA: Diagnosis not present

## 2017-09-01 DIAGNOSIS — M542 Cervicalgia: Secondary | ICD-10-CM | POA: Diagnosis not present

## 2017-09-01 DIAGNOSIS — R6884 Jaw pain: Secondary | ICD-10-CM | POA: Diagnosis not present

## 2017-09-01 DIAGNOSIS — R51 Headache: Secondary | ICD-10-CM | POA: Diagnosis not present

## 2017-09-04 DIAGNOSIS — R6884 Jaw pain: Secondary | ICD-10-CM | POA: Diagnosis not present

## 2017-09-04 DIAGNOSIS — R51 Headache: Secondary | ICD-10-CM | POA: Diagnosis not present

## 2017-09-04 DIAGNOSIS — M542 Cervicalgia: Secondary | ICD-10-CM | POA: Diagnosis not present

## 2017-09-08 DIAGNOSIS — R51 Headache: Secondary | ICD-10-CM | POA: Diagnosis not present

## 2017-09-08 DIAGNOSIS — R6884 Jaw pain: Secondary | ICD-10-CM | POA: Diagnosis not present

## 2017-09-08 DIAGNOSIS — M542 Cervicalgia: Secondary | ICD-10-CM | POA: Diagnosis not present

## 2017-09-10 DIAGNOSIS — M542 Cervicalgia: Secondary | ICD-10-CM | POA: Diagnosis not present

## 2017-09-10 DIAGNOSIS — R51 Headache: Secondary | ICD-10-CM | POA: Diagnosis not present

## 2017-09-10 DIAGNOSIS — R6884 Jaw pain: Secondary | ICD-10-CM | POA: Diagnosis not present

## 2017-09-15 DIAGNOSIS — M542 Cervicalgia: Secondary | ICD-10-CM | POA: Diagnosis not present

## 2017-09-15 DIAGNOSIS — R51 Headache: Secondary | ICD-10-CM | POA: Diagnosis not present

## 2017-09-15 DIAGNOSIS — R6884 Jaw pain: Secondary | ICD-10-CM | POA: Diagnosis not present

## 2017-09-18 DIAGNOSIS — R6884 Jaw pain: Secondary | ICD-10-CM | POA: Diagnosis not present

## 2017-09-18 DIAGNOSIS — R51 Headache: Secondary | ICD-10-CM | POA: Diagnosis not present

## 2017-09-18 DIAGNOSIS — M542 Cervicalgia: Secondary | ICD-10-CM | POA: Diagnosis not present

## 2017-09-24 DIAGNOSIS — M542 Cervicalgia: Secondary | ICD-10-CM | POA: Diagnosis not present

## 2017-09-24 DIAGNOSIS — R6884 Jaw pain: Secondary | ICD-10-CM | POA: Diagnosis not present

## 2017-09-24 DIAGNOSIS — R51 Headache: Secondary | ICD-10-CM | POA: Diagnosis not present

## 2017-10-14 DIAGNOSIS — J069 Acute upper respiratory infection, unspecified: Secondary | ICD-10-CM | POA: Diagnosis not present

## 2017-10-15 DIAGNOSIS — M9901 Segmental and somatic dysfunction of cervical region: Secondary | ICD-10-CM | POA: Diagnosis not present

## 2017-10-15 DIAGNOSIS — M9903 Segmental and somatic dysfunction of lumbar region: Secondary | ICD-10-CM | POA: Diagnosis not present

## 2017-10-15 DIAGNOSIS — M9902 Segmental and somatic dysfunction of thoracic region: Secondary | ICD-10-CM | POA: Diagnosis not present

## 2017-10-17 ENCOUNTER — Telehealth: Payer: Self-pay | Admitting: Neurology

## 2017-10-17 DIAGNOSIS — M9901 Segmental and somatic dysfunction of cervical region: Secondary | ICD-10-CM | POA: Diagnosis not present

## 2017-10-17 DIAGNOSIS — M9902 Segmental and somatic dysfunction of thoracic region: Secondary | ICD-10-CM | POA: Diagnosis not present

## 2017-10-17 DIAGNOSIS — M9903 Segmental and somatic dysfunction of lumbar region: Secondary | ICD-10-CM | POA: Diagnosis not present

## 2017-10-17 MED ORDER — TIZANIDINE HCL 4 MG PO CAPS: ORAL_CAPSULE | ORAL | 0 refills | Status: DC

## 2017-10-17 NOTE — Telephone Encounter (Signed)
She has had back pain for 2 weeks, no inciting event. Last night went to the bathroom and started having back spasms, the whole back. Severe. Flexeril has not worked. Carbis has not worked. Will try tizanidine. She is not pregnant. Discussed side effects, especially sedation.

## 2017-10-20 DIAGNOSIS — M9901 Segmental and somatic dysfunction of cervical region: Secondary | ICD-10-CM | POA: Diagnosis not present

## 2017-10-20 DIAGNOSIS — M9903 Segmental and somatic dysfunction of lumbar region: Secondary | ICD-10-CM | POA: Diagnosis not present

## 2017-10-20 DIAGNOSIS — M9902 Segmental and somatic dysfunction of thoracic region: Secondary | ICD-10-CM | POA: Diagnosis not present

## 2017-10-27 DIAGNOSIS — R6884 Jaw pain: Secondary | ICD-10-CM | POA: Diagnosis not present

## 2017-10-27 DIAGNOSIS — M542 Cervicalgia: Secondary | ICD-10-CM | POA: Diagnosis not present

## 2017-10-27 DIAGNOSIS — R51 Headache: Secondary | ICD-10-CM | POA: Diagnosis not present

## 2017-10-31 DIAGNOSIS — M9903 Segmental and somatic dysfunction of lumbar region: Secondary | ICD-10-CM | POA: Diagnosis not present

## 2017-10-31 DIAGNOSIS — M9902 Segmental and somatic dysfunction of thoracic region: Secondary | ICD-10-CM | POA: Diagnosis not present

## 2017-10-31 DIAGNOSIS — M9901 Segmental and somatic dysfunction of cervical region: Secondary | ICD-10-CM | POA: Diagnosis not present

## 2017-11-05 DIAGNOSIS — M542 Cervicalgia: Secondary | ICD-10-CM | POA: Diagnosis not present

## 2017-11-05 DIAGNOSIS — R6884 Jaw pain: Secondary | ICD-10-CM | POA: Diagnosis not present

## 2017-11-05 DIAGNOSIS — R51 Headache: Secondary | ICD-10-CM | POA: Diagnosis not present

## 2017-11-06 DIAGNOSIS — M9901 Segmental and somatic dysfunction of cervical region: Secondary | ICD-10-CM | POA: Diagnosis not present

## 2017-11-06 DIAGNOSIS — M9902 Segmental and somatic dysfunction of thoracic region: Secondary | ICD-10-CM | POA: Diagnosis not present

## 2017-11-06 DIAGNOSIS — M9903 Segmental and somatic dysfunction of lumbar region: Secondary | ICD-10-CM | POA: Diagnosis not present

## 2017-11-13 DIAGNOSIS — M542 Cervicalgia: Secondary | ICD-10-CM | POA: Diagnosis not present

## 2017-11-13 DIAGNOSIS — R51 Headache: Secondary | ICD-10-CM | POA: Diagnosis not present

## 2017-11-13 DIAGNOSIS — R6884 Jaw pain: Secondary | ICD-10-CM | POA: Diagnosis not present

## 2017-11-14 DIAGNOSIS — I10 Essential (primary) hypertension: Secondary | ICD-10-CM | POA: Diagnosis not present

## 2017-11-14 DIAGNOSIS — J302 Other seasonal allergic rhinitis: Secondary | ICD-10-CM | POA: Diagnosis not present

## 2017-11-21 DIAGNOSIS — M9901 Segmental and somatic dysfunction of cervical region: Secondary | ICD-10-CM | POA: Diagnosis not present

## 2017-11-21 DIAGNOSIS — M9902 Segmental and somatic dysfunction of thoracic region: Secondary | ICD-10-CM | POA: Diagnosis not present

## 2017-11-21 DIAGNOSIS — M9903 Segmental and somatic dysfunction of lumbar region: Secondary | ICD-10-CM | POA: Diagnosis not present

## 2017-11-27 DIAGNOSIS — M9902 Segmental and somatic dysfunction of thoracic region: Secondary | ICD-10-CM | POA: Diagnosis not present

## 2017-11-27 DIAGNOSIS — M9903 Segmental and somatic dysfunction of lumbar region: Secondary | ICD-10-CM | POA: Diagnosis not present

## 2017-11-27 DIAGNOSIS — M9901 Segmental and somatic dysfunction of cervical region: Secondary | ICD-10-CM | POA: Diagnosis not present

## 2017-12-23 DIAGNOSIS — Z885 Allergy status to narcotic agent status: Secondary | ICD-10-CM | POA: Diagnosis not present

## 2017-12-23 DIAGNOSIS — K5010 Crohn's disease of large intestine without complications: Secondary | ICD-10-CM | POA: Diagnosis not present

## 2017-12-23 DIAGNOSIS — Z880 Allergy status to penicillin: Secondary | ICD-10-CM | POA: Diagnosis not present

## 2018-01-02 DIAGNOSIS — M9903 Segmental and somatic dysfunction of lumbar region: Secondary | ICD-10-CM | POA: Diagnosis not present

## 2018-01-02 DIAGNOSIS — M9902 Segmental and somatic dysfunction of thoracic region: Secondary | ICD-10-CM | POA: Diagnosis not present

## 2018-01-02 DIAGNOSIS — M9901 Segmental and somatic dysfunction of cervical region: Secondary | ICD-10-CM | POA: Diagnosis not present

## 2018-01-22 DIAGNOSIS — M9903 Segmental and somatic dysfunction of lumbar region: Secondary | ICD-10-CM | POA: Diagnosis not present

## 2018-01-22 DIAGNOSIS — M9902 Segmental and somatic dysfunction of thoracic region: Secondary | ICD-10-CM | POA: Diagnosis not present

## 2018-01-22 DIAGNOSIS — M9901 Segmental and somatic dysfunction of cervical region: Secondary | ICD-10-CM | POA: Diagnosis not present

## 2018-01-26 ENCOUNTER — Encounter: Payer: Self-pay | Admitting: Diagnostic Neuroimaging

## 2018-01-26 ENCOUNTER — Ambulatory Visit (INDEPENDENT_AMBULATORY_CARE_PROVIDER_SITE_OTHER): Payer: 59 | Admitting: Diagnostic Neuroimaging

## 2018-01-26 VITALS — BP 147/93 | HR 78 | Ht 62.0 in | Wt 178.4 lb

## 2018-01-26 DIAGNOSIS — G35 Multiple sclerosis: Secondary | ICD-10-CM

## 2018-01-26 DIAGNOSIS — Z5181 Encounter for therapeutic drug level monitoring: Secondary | ICD-10-CM

## 2018-01-26 NOTE — Patient Instructions (Signed)
-   continue gilenya

## 2018-01-26 NOTE — Progress Notes (Signed)
  PATIENT: Ashley Diaz DOB: 08/18/1971  REASON FOR VISIT: follow up for MS HISTORY FROM: patient  Chief Complaint  Patient presents with  . Follow-up  . Multiple Sclerosis    doing well, is exercising.     HISTORY OF PRESENT ILLNESS:  UPDATE (01/26/18, VRP): Since last visit, doing well. Tolerating meds. Some back spasm in Nov 2018, relieved with tizanidine. No alleviating or aggravating factors. No issues with gilenya. On stelara, closer frequency of q6weeks from q8weeks (for slight increased crohns activity).  UPDATE (07/29/17, VRP): Since last visit, doing well. Had 1 episode 3 weeks ago of tingling in bilateral hands (lasted 1 day). Was under more stress at work and working more hours. Tolerating gilenya. No alleviating factors.   UPDATE 01/20/17: Since last visit, doing well. No new MS sxs. GI illness stable. Tolerating gilenya.   UPDATE 07/31/16: Since last visit, doing well. MS and crohn's are stable. Tolerating medications.   UPDATE 03/28/16: Since last visit, doing well with MS and crohn's. Had a skin yeast infection now on topical ketoconazole.  Next colonoscopy is in July 2017.   UPDATE 12/12/15: Since last visit, now on Stelara (ustekinumab) for crohn's. Continues on gilenya. No MS flares.   UPDATE 09/11/15: since last visit, was doing well except now has IBD (? Crohns). Now on prednisone. Some occ cramps in hands. Overall MS stable.  UPDATE 03/09/15 (VRP): Doing well. No new issues. Tolerating gilenya.   UPDATE 08/25/14 (LL): Since last visit, doing well. Tolerating gilenya. No side effects. No new MS symptoms. Has routine labs checked at PCP office every 3-6 months, have asked to fax results here. Has had ongoing abdominal pain, sent to [place] for workup, thought to be possibly a variant of Crohn's.   UPDATE 02/23/14 (LL):Since last visit, doing well. Tolerating gilenya. No side effects. No new MS symptoms. She and her husband were going to try to adopt a child but now  have decided it against it due to their age and uncertainty of her MS in the future.   UPDATE 08/25/13: Patient completed Prefer MS study and transitioned to gilenya. Since last visit, doing well. Tolerating gilenya. No side effects. No new MS symptoms. Numbness is feet resolved, but rarely returns if she does not exercise regularly.   UPDATE 12/02/11: Doing well. Not been on rebif since Feb 2012. No flares or attacks. Now it seems that her prior rxns where food related allergies (egg, dairy). She is ready to resume MS treatment. Also, had tried to get pregnant, but now is not planning to proceed with pregnancy.   UPDATE 12/27/10: had 2 episodes of skin flushing and itching. Now doing well during our office visit. No breathing problems, tongue or lip swelling.  12/24/10 - rebif injection at night; no events  12/25/10 - in morning, whole body flushed, red, hot, right before shower. lasting 1 hour  12/26/10 - rebif injection at night; no events  12/27/10 - similar event of flushing   UPDATE 06/26/10: Doing well. On rebif and tolerating injections. Started on 04/27/10. Some site rxn in arms. Back to exercise. Feels good overall.   PRIOR HPI: 38-year-old right-handed female with history of migraine headaches presenting for evaluation of bilateral foot numbness since September 2010. The patient woke up one morning and felt the sensation of pins and needles the bottom of her feet. She noticed it particularly in her third, fourth and fifth toes bilaterally. Over the next several weeks she noticed bilateral foot swelling. The   numbness progressed up to the level of just below her knees. She was evaluated in urgent care clinic where routine blood work was unremarkable. She had x-rays of the lumbar spine which were normal. The swelling in her feet gradually subsided and approximately 2-3 weeks ago the patient needles began to subside. Currently she feels a sensation of her feet be swollen even though they are not visibly  swollen. She denies problems in her hands. She denies facial numbness or weakness. She denies visual problems. There is no associated or alleviating factors. When she walks fast, jogs or physically exerts herself her symptoms are sometimes worsened.    REVIEW OF SYSTEMS: Full 14 system review of systems performed and negative except: only as per HPI.   ALLERGIES: Allergies  Allergen Reactions  . Codeine Hives  . Other Hives    Pt unsure Rebif  . Penicillins Hives    HOME MEDICATIONS: Outpatient Medications Prior to Visit  Medication Sig Dispense Refill  . amLODipine (NORVASC) 5 MG tablet Take 1 tablet (5 mg total) by mouth daily. 7 tablet 0  . Ascorbic Acid (VITAMIN C) 1000 MG tablet Take 2,000 mg by mouth.    . Cholecalciferol 5000 UNITS TABS Take by mouth.    . etonogestrel-ethinyl estradiol (NUVARING) 0.12-0.015 MG/24HR vaginal ring Place vaginally.    . ferrous gluconate (FERGON) 324 MG tablet Take 324 mg by mouth.    . GILENYA 0.5 MG CAPS TAKE 1 CAPSULE BY MOUTH DAILY 90 capsule 4  . hydrochlorothiazide (MICROZIDE) 12.5 MG capsule Take 12.5 mg by mouth daily.    . levocetirizine (XYZAL) 5 MG tablet Take 5 mg by mouth every evening.    . Multiple Vitamins-Minerals (THERA-M) TABS Take 1 tablet by mouth.    . STELARA 90 MG/ML SOSY injection Inject 90 mg into the skin. Every 8 wks    . valACYclovir (VALTREX) 500 MG tablet Take 500 mg by mouth 2 (two) times daily.    . tizanidine (ZANAFLEX) 4 MG capsule Start with 1 capsule 3x a day. May increase to 2 capsules 3x a day if no side effects. Watch for sedation. (Patient not taking: Reported on 01/26/2018) 84 capsule 0   Facility-Administered Medications Prior to Visit  Medication Dose Route Frequency Provider Last Rate Last Dose  . gadopentetate dimeglumine (MAGNEVIST) injection 15 mL  15 mL Intravenous Once PRN ,  R, MD      . gadopentetate dimeglumine (MAGNEVIST) injection 20 mL  20 mL Intravenous Once PRN ,   R, MD        PHYSICAL EXAM Vitals:   01/26/18 1509  BP: (!) 147/93  Pulse: 78  Weight: 178 lb 6.4 oz (80.9 kg)  Height: 5' 2" (1.575 m)   Body mass index is 32.63 kg/m.  Wt Readings from Last 3 Encounters:  01/26/18 178 lb 6.4 oz (80.9 kg)  07/29/17 177 lb 9.6 oz (80.6 kg)  01/20/17 176 lb 6.4 oz (80 kg)    GENERAL EXAM: Patient is in no distress; well developed, nourished and groomed; neck is supple  CARDIOVASCULAR: Regular rate and rhythm, no murmurs, no carotid bruits  NEUROLOGIC: MENTAL STATUS: awake, alert, language fluent, comprehension intact, naming intact, fund of knowledge appropriate CRANIAL NERVE: pupils equal and reactive to light, visual fields full to confrontation, extraocular muscles intact, no nystagmus, facial sensation and strength symmetric, hearing intact, palate elevates symmetrically, uvula midline, shoulder shrug symmetric, tongue midline. MOTOR: normal bulk and tone, full strength in the BUE, BLE SENSORY: normal and   symmetric to light touch COORDINATION: finger-nose-finger, fine finger movements normal REFLEXES: deep tendon reflexes present and symmetric GAIT/STATION: narrow based gait; able to walk tandem    DIAGNOSTICS/IMAGING:  Lab Results  Component Value Date   WBC 3.6 01/20/2017   HGB 12.4 01/20/2017   HCT 36.5 01/20/2017   MCV 84 01/20/2017   PLT 263 01/20/2017   Lymphocytes Absolute  Date Value Ref Range Status  01/20/2017 0.6 (L) 0.7 - 3.1 x10E3/uL Final  10/12/2015 0.5 (L) 0.7 - 3.1 x10E3/uL Final  09/11/2015 0.3 (L) 0.7 - 3.1 x10E3/uL Final   Vit D, 25-Hydroxy  Date Value Ref Range Status  01/20/2017 39.8 30.0 - 100.0 ng/mL Final    Comment:    Vitamin D deficiency has been defined by the Institute of Medicine and an Endocrine Society practice guideline as a level of serum 25-OH vitamin D less than 20 ng/mL (1,2). The Endocrine Society went on to further define vitamin D insufficiency as a level between 21 and 29  ng/mL (2). 1. IOM (Institute of Medicine). 2010. Dietary reference    intakes for calcium and D. [place]: The    [employer]. 2. Holick MF, Binkley Rosepine, Bischoff-Ferrari HA, et al.    Evaluation, treatment, and prevention of vitamin D    deficiency: an Endocrine Society clinical practice    guideline. JCEM. 2011 Jul; 96(7):1911-30.     06/17/17 WBC 4.4 (L) 4.5 - 11.0 10*9/L [facility]  RBC 4.56 4.00 - 5.20 10*12/L [facility]  HGB 13.2 12.0 - 16.0 g/dL [facility]  HCT 39.9 36.0 - 46.0 % [facility]  MCV 87.4 80.0 - 100.0 fL [facility]  MCH 29.0 26.0 - 34.0 pg [facility]  MCHC 33.2 31.0 - 37.0 g/dL [facility]  RDW 13.1 12.0 - 15.0 % [facility]  MPV 7.4 7.0 - 10.0 fL [facility]  Platelet 305 150 - 440 10*9/L [facility]     01/24/10 Lumbar puncture - WBC 1, RBC 1, IgG index 1.7 (h), Twelve (12) IgG bands were observed in the CSF. No such bands were detected in the serum sample.   01/24/10 labs - ANA, RF, ANCA, HIV, ESR   09/16/13 MRI brain  1. Few periventricular and subcortical chronic demyelinating plaques.  2. No acute plaques.  3. No significant change from MRI on 03/25/12.   03/02/14 MRI brain - MRI scan of the brain showing scattered subcortical and periventricular white matter hyperintense CT is compatible with patient's known diagnosis of multiple sclerosis. No enhancing lesions are noted. There is incidental chronic severe paranasal sinus inflammatory disease noted.  09/12/15 JCV ab - negative  12/27/15 MRI brain (with and without contrast) demonstrating: 1. Few periventricular and subcortical chronic demyelinating plaques.  2. No acute plaques.  08/06/17 MRI brain (with and without) demonstrating: 1. There are a few  small, round and ovoid, periventricular and subcortical chronic demyelinating plaques. 2. No acute plaques. 3. No significant change from MRI on 12/27/15.   ASSESSMENT: 47 y.o. female with paresthesias and swelling of her bilateral feet since Septemeber 2010, which have gradually improved. Diagnosed with multiple sclerosis by MRI brain, t-spine, labs and LP (12 OCBs). Was on rebif 04/27/10 until Feb 2012 (stopped b/c of possible rxns and also was trying to get pregnant). Now on gilenya and doing well.  Now with inflammatory bowel disease (crohn's dz), now on  Stelara (ustekinumab; every 2 months pen injection at home).    Dx:  MS (multiple sclerosis) ([place])  Encounter for therapeutic drug monitoring    PLAN:   MULTIPLE SCLEROSIS (stable) - continue gilenya - continue vitamin D - continue checking CBC, CMP (per GI or PCP)  Return in about 6 months (around 07/29/2018).     Penni Bombard, MD 12/26/2246, 2:50 PM Certified in Neurology, Neurophysiology and Neuroimaging  [facility] [address], [place], [place] [address] [phone]

## 2018-02-11 DIAGNOSIS — M9903 Segmental and somatic dysfunction of lumbar region: Secondary | ICD-10-CM | POA: Diagnosis not present

## 2018-02-11 DIAGNOSIS — M9902 Segmental and somatic dysfunction of thoracic region: Secondary | ICD-10-CM | POA: Diagnosis not present

## 2018-02-11 DIAGNOSIS — M9901 Segmental and somatic dysfunction of cervical region: Secondary | ICD-10-CM | POA: Diagnosis not present

## 2018-02-27 DIAGNOSIS — M25561 Pain in right knee: Secondary | ICD-10-CM | POA: Diagnosis not present

## 2018-04-02 DIAGNOSIS — Z1231 Encounter for screening mammogram for malignant neoplasm of breast: Secondary | ICD-10-CM | POA: Diagnosis not present

## 2018-04-02 DIAGNOSIS — Z01419 Encounter for gynecological examination (general) (routine) without abnormal findings: Secondary | ICD-10-CM | POA: Diagnosis not present

## 2018-04-06 DIAGNOSIS — M9902 Segmental and somatic dysfunction of thoracic region: Secondary | ICD-10-CM | POA: Diagnosis not present

## 2018-04-06 DIAGNOSIS — M9903 Segmental and somatic dysfunction of lumbar region: Secondary | ICD-10-CM | POA: Diagnosis not present

## 2018-04-06 DIAGNOSIS — M9901 Segmental and somatic dysfunction of cervical region: Secondary | ICD-10-CM | POA: Diagnosis not present

## 2018-04-16 DIAGNOSIS — M9903 Segmental and somatic dysfunction of lumbar region: Secondary | ICD-10-CM | POA: Diagnosis not present

## 2018-04-16 DIAGNOSIS — M9902 Segmental and somatic dysfunction of thoracic region: Secondary | ICD-10-CM | POA: Diagnosis not present

## 2018-04-16 DIAGNOSIS — M9901 Segmental and somatic dysfunction of cervical region: Secondary | ICD-10-CM | POA: Diagnosis not present

## 2018-05-04 DIAGNOSIS — D509 Iron deficiency anemia, unspecified: Secondary | ICD-10-CM | POA: Diagnosis not present

## 2018-05-04 DIAGNOSIS — M7022 Olecranon bursitis, left elbow: Secondary | ICD-10-CM | POA: Diagnosis not present

## 2018-05-04 DIAGNOSIS — I10 Essential (primary) hypertension: Secondary | ICD-10-CM | POA: Diagnosis not present

## 2018-05-04 DIAGNOSIS — E785 Hyperlipidemia, unspecified: Secondary | ICD-10-CM | POA: Diagnosis not present

## 2018-05-04 DIAGNOSIS — Z0000 Encounter for general adult medical examination without abnormal findings: Secondary | ICD-10-CM | POA: Diagnosis not present

## 2018-05-06 DIAGNOSIS — M9901 Segmental and somatic dysfunction of cervical region: Secondary | ICD-10-CM | POA: Diagnosis not present

## 2018-05-06 DIAGNOSIS — M9902 Segmental and somatic dysfunction of thoracic region: Secondary | ICD-10-CM | POA: Diagnosis not present

## 2018-05-06 DIAGNOSIS — M9903 Segmental and somatic dysfunction of lumbar region: Secondary | ICD-10-CM | POA: Diagnosis not present

## 2018-05-08 DIAGNOSIS — K6389 Other specified diseases of intestine: Secondary | ICD-10-CM | POA: Diagnosis not present

## 2018-05-08 DIAGNOSIS — I10 Essential (primary) hypertension: Secondary | ICD-10-CM | POA: Diagnosis not present

## 2018-05-08 DIAGNOSIS — K5010 Crohn's disease of large intestine without complications: Secondary | ICD-10-CM | POA: Diagnosis not present

## 2018-05-08 DIAGNOSIS — K5090 Crohn's disease, unspecified, without complications: Secondary | ICD-10-CM | POA: Diagnosis not present

## 2018-05-08 DIAGNOSIS — Z1211 Encounter for screening for malignant neoplasm of colon: Secondary | ICD-10-CM | POA: Diagnosis not present

## 2018-05-19 DIAGNOSIS — H524 Presbyopia: Secondary | ICD-10-CM | POA: Diagnosis not present

## 2018-06-02 DIAGNOSIS — M9902 Segmental and somatic dysfunction of thoracic region: Secondary | ICD-10-CM | POA: Diagnosis not present

## 2018-06-02 DIAGNOSIS — M9901 Segmental and somatic dysfunction of cervical region: Secondary | ICD-10-CM | POA: Diagnosis not present

## 2018-06-02 DIAGNOSIS — M9903 Segmental and somatic dysfunction of lumbar region: Secondary | ICD-10-CM | POA: Diagnosis not present

## 2018-06-25 DIAGNOSIS — M9903 Segmental and somatic dysfunction of lumbar region: Secondary | ICD-10-CM | POA: Diagnosis not present

## 2018-06-25 DIAGNOSIS — M9902 Segmental and somatic dysfunction of thoracic region: Secondary | ICD-10-CM | POA: Diagnosis not present

## 2018-06-25 DIAGNOSIS — M9901 Segmental and somatic dysfunction of cervical region: Secondary | ICD-10-CM | POA: Diagnosis not present

## 2018-07-30 DIAGNOSIS — Z885 Allergy status to narcotic agent status: Secondary | ICD-10-CM | POA: Diagnosis not present

## 2018-07-30 DIAGNOSIS — K5010 Crohn's disease of large intestine without complications: Secondary | ICD-10-CM | POA: Diagnosis not present

## 2018-07-30 DIAGNOSIS — Z79899 Other long term (current) drug therapy: Secondary | ICD-10-CM | POA: Diagnosis not present

## 2018-07-30 DIAGNOSIS — G35 Multiple sclerosis: Secondary | ICD-10-CM | POA: Diagnosis not present

## 2018-07-30 DIAGNOSIS — Z880 Allergy status to penicillin: Secondary | ICD-10-CM | POA: Diagnosis not present

## 2018-08-04 ENCOUNTER — Other Ambulatory Visit: Payer: Self-pay | Admitting: Diagnostic Neuroimaging

## 2018-08-05 DIAGNOSIS — H2513 Age-related nuclear cataract, bilateral: Secondary | ICD-10-CM | POA: Diagnosis not present

## 2018-08-10 NOTE — Progress Notes (Signed)
[facility] Note  08/11/2018     CHIEF COMPLAINT Patient presents for Blurred Vision and retinal eval   HISTORY OF PRESENT ILLNESS: Ashley Diaz is a 47 y.o. female who presents to the clinic today for:   HPI    Blurred Vision    In both eyes.  Onset was gradual.  Vision is blurred.  Severity is moderate.  This started 2 years ago.  Occurring constantly.  It is worse in the evening and throughout the day.  Context:  distance vision, driving, reading and near vision.  Since onset it is gradually worsening.  Associated symptoms include glare, haloes and tearing.  Negative for double vision, a need for brighter lights, dryness, eye pain, redness, trauma, pain with eye movement, abnormal color vision, headache, scalp tenderness, jaw claudication, shoulder/hip pain, fever, weight loss and fatigue.  Treatments tried include glasses.  Response to treatment was no improvement.  I, the attending physician,  performed the HPI with the patient and updated documentation appropriately.          Comments    Patient referred for retinal eval. Needs clearance for cataract surgery. Scheduled for CE with IOL OS on 08-25-2018 and CE with IOL OD on 09-01-2018. Vision blurry OU. Patient denies floaters, FOL. History of myopia prior to [place] surgery 2002.        Last edited by Bernarda Caffey, MD on 08/11/2018  2:50 PM. (History)    Pt states she is to have cataract sx with Dr. Lucita Ferrara; Pt states she had Lasik in 2002 with Dr. Lucita Ferrara; Pt denies any ocular trauma; Pt endorses history of oral steroid use x 4 months for Crohns; Pt reports being on stelara for Crohns and MS; Pt states she routinely sees [facility]. Clydene Laming at [facility];   Referring physician: Vevelyn Royals, MD No address on file  HISTORICAL INFORMATION:   Selected notes from the MEDICAL RECORD NUMBER Referred by Dr. Vevelyn Royals for cataract clearance LEE: 09.11.19 (K. Stonecipher) [BCVA: OD:  20/25-2 OS: 20/50-2] Ocular Hx-cataracts OU PMH-Crohn's disease, herpes simplex, HTN, MS    CURRENT MEDICATIONS: Current Outpatient Medications (Ophthalmic Drugs)  Medication Sig  . Polyethyl Glycol-Propyl Glycol 0.4-0.3 % SOLN Place into both eyes as needed.   No current facility-administered medications for this visit.  (Ophthalmic Drugs)   Current Outpatient Medications (Other)  Medication Sig  . amLODipine (NORVASC) 5 MG tablet Take 1 tablet (5 mg total) by mouth daily.  . Ascorbic Acid (VITAMIN C) 1000 MG tablet Take 2,000 mg by mouth.  . Cholecalciferol 5000 units capsule Take by mouth daily.  Marland Kitchen GILENYA 0.5 MG CAPS TAKE 1 CAPSULE BY MOUTH DAILY  . hydrochlorothiazide (MICROZIDE) 12.5 MG capsule Take 12.5 mg by mouth daily.  Marland Kitchen levocetirizine (XYZAL) 5 MG tablet Take 5 mg by mouth every evening.  . Multiple Vitamins-Minerals (MULTIPLE VITAMINS/WOMENS PO) daily.  Delsa Grana 90 MG/ML SOSY injection Inject 90 mg into the skin. Every 8 wks  . valACYclovir (VALTREX) 500 MG tablet Take 500 mg by mouth 2 (two) times daily.  Marland Kitchen etonogestrel-ethinyl estradiol (NUVARING) 0.12-0.015 MG/24HR vaginal ring Place vaginally.  . hydrochlorothiazide (MICROZIDE) 12.5 MG capsule Take 12.5 mg by mouth daily.  . tizanidine (ZANAFLEX) 4 MG capsule Start with 1 capsule 3x a day. May increase to 2 capsules 3x a day if no side effects. Watch for sedation. (Patient not taking: Reported on 08/12/2018)   No current facility-administered medications for this visit.  (Other)  Facility-Administered Medications Ordered in Other Visits (Other)  Medication Route  . gadopentetate dimeglumine (MAGNEVIST) injection 15 mL Intravenous  . gadopentetate dimeglumine (MAGNEVIST) injection 20 mL Intravenous      REVIEW OF SYSTEMS: ROS    Positive for: Endocrine, Eyes   Negative for: Constitutional, Gastrointestinal, Neurological, Skin, Genitourinary, Musculoskeletal, HENT, Cardiovascular, Respiratory, Psychiatric,  Allergic/Imm, Heme/Lymph   Last edited by Roselee Nova D on 08/11/2018  2:11 PM. (History)       ALLERGIES Allergies  Allergen Reactions  . Codeine Hives  . Other Hives    Pt unsure Rebif  . Penicillins Hives    PAST MEDICAL HISTORY Past Medical History:  Diagnosis Date  . Allergy   . Crohn's disease ([place])   . Endometriosis   . Herpes simplex without mention of complication   . Hypertension   . Migraine   . Multiple sclerosis ([place])    Past Surgical History:  Procedure Laterality Date  . CESAREAN SECTION    . CRYOTHERAPY    . FOOT SURGERY    . LAPAROSCOPY    . LASIK Bilateral 2001  . UMBILICAL HERNIA REPAIR      FAMILY HISTORY Family History  Problem Relation Age of Onset  . Diabetes Mother   . Glaucoma Mother   . Diabetes Father   . Cancer Father        throat, lung with metastases  . Diabetes Sister   . Multiple sclerosis Sister   . Glaucoma Unknown   . Colon cancer Paternal Grandfather   . Retinal detachment Paternal Grandfather     SOCIAL HISTORY Social History   Tobacco Use  . Smoking status: Never Smoker  . Smokeless tobacco: Never Used  Substance Use Topics  . Alcohol use: No    Comment: 1-2 glasses monthly  . Drug use: No         OPHTHALMIC EXAM:  Base Eye Exam    Visual Acuity (Snellen - Linear)      Right Left   Dist cc 20/20 -2 20/40 -2   Dist ph cc  20/25 -2   Correction:  Glasses       Tonometry (Tonopen, 2:29 PM)      Right Left   Pressure 18 18       Pupils      Dark Light Shape React APD   Right 4 3 Round Slow None   Left 4 3 Round Slow None       Visual Fields (Counting fingers)      Left Right    Full Full       Extraocular Movement      Right Left    Full, Ortho Full, Ortho       Neuro/Psych    Oriented x3:  Yes   Mood/Affect:  Normal       Dilation    Both eyes:  1.0% Mydriacyl, 2.5% Phenylephrine @ 2:29 PM        Slit Lamp and Fundus Exam    Slit Lamp Exam      Right Left   Lids/Lashes  Dermatochalasis - upper lid, Meibomian gland dysfunction Dermatochalasis - upper lid, Meibomian gland dysfunction   Conjunctiva/Sclera Melanosis Melanosis   Cornea Trace Punctate epithelial erosions, lasik scar Trace Punctate epithelial erosions, lasik scar, Debris in tear film   Anterior Chamber Deep and quiet Deep and quiet   Iris Round and dilated Round and dilated   Lens 1+ Nuclear sclerosis, 1-2+ Cortical cataract 1+ Nuclear sclerosis, 2+  Cortical cataract, 2-3+ Posterior subcapsular cataract   Vitreous Vitreous syneresis Vitreous syneresis       Fundus Exam      Right Left   Disc Cupping, Pink and Sharp rim Cupping, thin inferior rim   C/D Ratio 0.7 0.75   Macula Good foveal reflex, Retinal pigment epithelial mottling, No heme or edema Flat, Good foveal reflex, Retinal pigment epithelial mottling, No heme or edema   Vessels Normal Normal   Periphery Attached, punctate RPE changes at 0600 Attached, punctate RPE changes at 0600        Refraction    Wearing Rx      Sphere Add   Right +0.25 sph +1.25   Left +0.75 sph +1.25   Age:  1 yr   Type:  PAL       Manifest Refraction (Retinoscopy)      Sphere Cylinder Axis Dist VA   Right +0.50 sph   20/20-1   Left +0.75 +0.25 155 20/30-2          IMAGING AND PROCEDURES  Imaging and Procedures for @TODAY @  OCT, Retina - OU - Both Eyes       Right Eye Quality was good. Central Foveal Thickness: 253. Progression has no prior data. Findings include normal foveal contour, no IRF, no SRF.   Left Eye Quality was borderline. Central Foveal Thickness: 257. Progression has no prior data. Findings include normal foveal contour, no IRF, no SRF.   Notes *Images captured and stored on drive  Diagnosis / Impression:  NFP, No IRF/SRF OU  Clinical management:  See below  Abbreviations: NFP - Normal foveal profile. CME - cystoid macular edema. PED - pigment epithelial detachment. IRF - intraretinal fluid. SRF - subretinal fluid. EZ  - ellipsoid zone. ERM - epiretinal membrane. ORA - outer retinal atrophy. ORT - outer retinal tubulation. SRHM - subretinal hyper-reflective material                  ASSESSMENT/PLAN:    ICD-10-CM   1. Combined forms of age-related cataract of both eyes H25.813   2. Myopia of both eyes H52.13   3. Hx of LASIK Z98.890   4. Vitreous syneresis of both eyes H43.393   5. Retinal edema H35.81 OCT, Retina - OU - Both Eyes  6. Multiple sclerosis ([place]) G35   7. Crohn's disease with complication, unspecified gastrointestinal tract location ([place]) K50.919     1. Combined form age-related cataract OU, OS > OD - The symptoms of cataract, surgical options, and treatments and risks were discussed with patient. - discussed diagnosis and progression - OS with visually significant PSC - under the expert management of Dr. Lucita Ferrara - clear from a retina standpoint to proceed with cataract surgery when pt and surgeon are ready - f/u here prn  2,3. History of myopia s/p LASIK (2002) - stable, doing well - management per Dr. Lucita Ferrara  4. Vitreous syneresis OU  Discussed findings and prognosis  No RT or RD on 360 scleral depressed exam  Reviewed s/s of RT/RD  Strict return precautions for any such RT/RD signs/symptoms  5. No retinal edema on exam or OCT  6,7. History of MS and Crohns-  - well controlled on current meds - no ocular inflammation OU - monitor   Ophthalmic Meds Ordered this visit:  No orders of the defined types were placed in this encounter.      Return if symptoms worsen or fail to improve.  There are no Patient Instructions on  file for this visit.   Explained the diagnoses, plan, and follow up with the patient and they expressed understanding.  Patient expressed understanding of the importance of proper follow up care.   This document serves as a record of services personally performed by Gardiner Sleeper, MD, PhD. It was created on their behalf by Ernest Mallick, OA, an ophthalmic assistant. The creation of this record is the provider's dictation and/or activities during the visit.    Electronically signed by: Ernest Mallick, OA  09.16.19 12:00 AM   This document serves as a record of services personally performed by Gardiner Sleeper, MD, PhD. It was created on their behalf by Catha Brow, [place], a certified ophthalmic assistant. The creation of this record is the provider's dictation and/or activities during the visit.  Electronically signed by: Catha Brow, [place]  09.17.19 12:00 AM   Gardiner Sleeper, M.D., Ph.D. Diseases & Surgery of the Retina and Vitreous [facility]   I have reviewed the above documentation for accuracy and completeness, and I agree with the above. Gardiner Sleeper, M.D., Ph.D. 08/13/18 12:00 AM    Abbreviations: M myopia (nearsighted); A astigmatism; H hyperopia (farsighted); P presbyopia; Mrx spectacle prescription;  CTL contact lenses; OD right eye; OS left eye; OU both eyes  XT exotropia; ET esotropia; PEK punctate epithelial keratitis; PEE punctate epithelial erosions; DES dry eye syndrome; MGD meibomian gland dysfunction; ATs artificial tears; PFAT's preservative free artificial tears; [place] nuclear sclerotic cataract; PSC posterior subcapsular cataract; ERM epi-retinal membrane; PVD posterior vitreous detachment; RD retinal detachment; DM diabetes mellitus; DR diabetic retinopathy; NPDR non-proliferative diabetic retinopathy; PDR proliferative diabetic retinopathy; CSME clinically significant macular edema; DME diabetic macular edema; dbh dot blot hemorrhages; CWS cotton wool spot; POAG primary open angle glaucoma; C/D cup-to-disc ratio; HVF humphrey visual field; GVF goldmann visual field; OCT optical coherence tomography; IOP intraocular pressure; BRVO Branch retinal vein occlusion; CRVO central retinal vein occlusion; CRAO central retinal artery occlusion; BRAO branch retinal artery occlusion; RT  retinal tear; SB scleral buckle; PPV pars plana vitrectomy; VH Vitreous hemorrhage; PRP panretinal laser photocoagulation; IVK intravitreal kenalog; VMT vitreomacular traction; MH Macular hole;  NVD neovascularization of the disc; NVE neovascularization elsewhere; AREDS age related eye disease study; ARMD age related macular degeneration; POAG primary open angle glaucoma; EBMD epithelial/anterior basement membrane dystrophy; ACIOL anterior chamber intraocular lens; IOL intraocular lens; PCIOL posterior chamber intraocular lens; Phaco/IOL phacoemulsification with intraocular lens placement; [place] photorefractive keratectomy; LASIK laser assisted in situ keratomileusis; HTN hypertension; DM diabetes mellitus; COPD chronic obstructive pulmonary disease

## 2018-08-11 ENCOUNTER — Ambulatory Visit (INDEPENDENT_AMBULATORY_CARE_PROVIDER_SITE_OTHER): Payer: 59 | Admitting: Ophthalmology

## 2018-08-11 ENCOUNTER — Encounter (INDEPENDENT_AMBULATORY_CARE_PROVIDER_SITE_OTHER): Payer: Self-pay | Admitting: Ophthalmology

## 2018-08-11 DIAGNOSIS — Z9889 Other specified postprocedural states: Secondary | ICD-10-CM | POA: Diagnosis not present

## 2018-08-11 DIAGNOSIS — H3581 Retinal edema: Secondary | ICD-10-CM | POA: Diagnosis not present

## 2018-08-11 DIAGNOSIS — K50919 Crohn's disease, unspecified, with unspecified complications: Secondary | ICD-10-CM

## 2018-08-11 DIAGNOSIS — H43393 Other vitreous opacities, bilateral: Secondary | ICD-10-CM | POA: Diagnosis not present

## 2018-08-11 DIAGNOSIS — H5213 Myopia, bilateral: Secondary | ICD-10-CM | POA: Diagnosis not present

## 2018-08-11 DIAGNOSIS — H25813 Combined forms of age-related cataract, bilateral: Secondary | ICD-10-CM

## 2018-08-11 DIAGNOSIS — G35 Multiple sclerosis: Secondary | ICD-10-CM

## 2018-08-12 ENCOUNTER — Encounter (INDEPENDENT_AMBULATORY_CARE_PROVIDER_SITE_OTHER): Payer: Self-pay | Admitting: Ophthalmology

## 2018-08-12 ENCOUNTER — Encounter: Payer: Self-pay | Admitting: Diagnostic Neuroimaging

## 2018-08-12 ENCOUNTER — Ambulatory Visit (INDEPENDENT_AMBULATORY_CARE_PROVIDER_SITE_OTHER): Payer: 59 | Admitting: Diagnostic Neuroimaging

## 2018-08-12 ENCOUNTER — Encounter (INDEPENDENT_AMBULATORY_CARE_PROVIDER_SITE_OTHER): Payer: 59 | Admitting: Ophthalmology

## 2018-08-12 VITALS — BP 151/97 | HR 83 | Ht 62.0 in | Wt 180.0 lb

## 2018-08-12 DIAGNOSIS — G35 Multiple sclerosis: Secondary | ICD-10-CM

## 2018-08-12 DIAGNOSIS — Z5181 Encounter for therapeutic drug level monitoring: Secondary | ICD-10-CM

## 2018-08-12 DIAGNOSIS — K50119 Crohn's disease of large intestine with unspecified complications: Secondary | ICD-10-CM

## 2018-08-12 NOTE — Patient Instructions (Signed)
-   continue gilenya

## 2018-08-12 NOTE — Progress Notes (Signed)
PATIENT: Ashley Diaz DOB: 09-10-1971  REASON FOR VISIT: follow up for MS HISTORY FROM: patient  Chief Complaint  Patient presents with  . Follow-up  . Multiple Sclerosis    on gilenya , doing ok.  Has upcoming cataracts surgery in oct 2019    HISTORY OF PRESENT ILLNESS:  UPDATE (08/12/18, VRP): Since last visit, doing well. Symptoms are stable. Severity is mild. No alleviating or aggravating factors. Tolerating gilenya. Last CBC 07/30/18 and abs lymphs 0.6. Some blurred in left eye (will have cataract surgery next month).  UPDATE (01/26/18, VRP): Since last visit, doing well. Tolerating meds. Some back spasm in Nov 2018, relieved with tizanidine. No alleviating or aggravating factors. No issues with gilenya. On stelara, closer frequency of q6weeks from q8weeks (for slight increased crohns activity).  UPDATE (07/29/17, VRP): Since last visit, doing well. Had 1 episode 3 weeks ago of tingling in bilateral hands (lasted 1 day). Was under more stress at work and working more hours. Tolerating gilenya. No alleviating factors.   UPDATE 01/20/17: Since last visit, doing well. No new MS sxs. GI illness stable. Tolerating gilenya.   UPDATE 07/31/16: Since last visit, doing well. MS and crohn's are stable. Tolerating medications.   UPDATE 03/28/16: Since last visit, doing well with MS and crohn's. Had a skin yeast infection now on topical ketoconazole.  Next colonoscopy is in July 2017.   UPDATE 12/12/15: Since last visit, now on Stelara (ustekinumab) for crohn's. Continues on gilenya. No MS flares.   UPDATE 09/11/15: since last visit, was doing well except now has IBD (? Crohns). Now on prednisone. Some occ cramps in hands. Overall MS stable.  UPDATE 03/09/15 (VRP): Doing well. No new issues. Tolerating gilenya.   UPDATE 08/25/14 (LL): Since last visit, doing well. Tolerating gilenya. No side effects. No new MS symptoms. Has routine labs checked at PCP office every 3-6 months, have asked to fax  results here. Has had ongoing abdominal pain, sent to [facility] for workup, thought to be possibly a variant of Crohn's.   UPDATE 02/23/14 (LL):Since last visit, doing well. Tolerating gilenya. No side effects. No new MS symptoms. She and her husband were going to try to adopt a child but now have decided it against it due to their age and uncertainty of her MS in the future.   UPDATE 08/25/13: Patient completed Prefer MS study and transitioned to gilenya. Since last visit, doing well. Tolerating gilenya. No side effects. No new MS symptoms. Numbness is feet resolved, but rarely returns if she does not exercise regularly.   UPDATE 12/02/11: Doing well. Not been on rebif since Feb 2012. No flares or attacks. Now it seems that her prior rxns where food related allergies (egg, dairy). She is ready to resume MS treatment. Also, had tried to get pregnant, but now is not planning to proceed with pregnancy.   UPDATE 12/27/10: had 2 episodes of skin flushing and itching. Now doing well during our office visit. No breathing problems, tongue or lip swelling.  12/24/10 - rebif injection at night; no events  12/25/10 - in morning, whole body flushed, red, hot, right before shower. lasting 1 hour  12/26/10 - rebif injection at night; no events  12/27/10 - similar event of flushing   UPDATE 06/26/10: Doing well. On rebif and tolerating injections. Started on 04/27/10. Some site rxn in arms. Back to exercise. Feels good overall.   PRIOR HPI: 47 year old right-handed female with history of migraine headaches presenting for evaluation of  bilateral foot numbness since September 2010. The patient woke up one morning and felt the sensation of pins and needles the bottom of her feet. She noticed it particularly in her third, fourth and fifth toes bilaterally. Over the next several weeks she noticed bilateral foot swelling. The numbness progressed up to the level of just below her knees. She was evaluated in urgent care clinic where  routine blood work was unremarkable. She had x-rays of the lumbar spine which were normal. The swelling in her feet gradually subsided and approximately 2-3 weeks ago the patient needles began to subside. Currently she feels a sensation of her feet be swollen even though they are not visibly swollen. She denies problems in her hands. She denies facial numbness or weakness. She denies visual problems. There is no associated or alleviating factors. When she walks fast, jogs or physically exerts herself her symptoms are sometimes worsened.    REVIEW OF SYSTEMS: Full 14 system review of systems performed and negative except: joint pain light sens blurred vision (cataracts).   ALLERGIES: Allergies  Allergen Reactions  . Codeine Hives  . Other Hives    Pt unsure Rebif  . Penicillins Hives    HOME MEDICATIONS: Outpatient Medications Prior to Visit  Medication Sig Dispense Refill  . amLODipine (NORVASC) 5 MG tablet Take 1 tablet (5 mg total) by mouth daily. 7 tablet 0  . Ascorbic Acid (VITAMIN C) 1000 MG tablet Take 2,000 mg by mouth.    . Cholecalciferol 5000 units capsule Take by mouth daily.    Marland Kitchen etonogestrel-ethinyl estradiol (NUVARING) 0.12-0.015 MG/24HR vaginal ring Place vaginally.    Marland Kitchen GILENYA 0.5 MG CAPS TAKE 1 CAPSULE BY MOUTH DAILY 90 capsule 4  . hydrochlorothiazide (MICROZIDE) 12.5 MG capsule Take 12.5 mg by mouth daily.    . hydrochlorothiazide (MICROZIDE) 12.5 MG capsule Take 12.5 mg by mouth daily.    Marland Kitchen levocetirizine (XYZAL) 5 MG tablet Take 5 mg by mouth every evening.    . Multiple Vitamins-Minerals (MULTIPLE VITAMINS/WOMENS PO) daily.    Vladimir Faster Glycol-Propyl Glycol 0.4-0.3 % SOLN Place into both eyes as needed.    Delsa Grana 90 MG/ML SOSY injection Inject 90 mg into the skin. Every 8 wks    . valACYclovir (VALTREX) 500 MG tablet Take 500 mg by mouth 2 (two) times daily.    . Ascorbic Acid (VITAMIN C) 1000 MG tablet Take 1,000 mg by mouth daily.    . Cholecalciferol 5000  UNITS TABS Take by mouth.    . ferrous gluconate (FERGON) 324 MG tablet Take 324 mg by mouth.    . levocetirizine (XYZAL) 5 MG tablet Take 5 mg by mouth daily.    . Multiple Vitamins-Minerals (THERA-M) TABS Take 1 tablet by mouth.    . ustekinumab (STELARA) 90 MG/ML SOSY injection Inject 90 mg into the skin.    . valACYclovir (VALTREX) 500 MG tablet Take 500 mg by mouth daily.    . tizanidine (ZANAFLEX) 4 MG capsule Start with 1 capsule 3x a day. May increase to 2 capsules 3x a day if no side effects. Watch for sedation. (Patient not taking: Reported on 08/12/2018) 84 capsule 0   Facility-Administered Medications Prior to Visit  Medication Dose Route Frequency Provider Last Rate Last Dose  . gadopentetate dimeglumine (MAGNEVIST) injection 15 mL  15 mL Intravenous Once PRN Vernica Wachtel R, MD      . gadopentetate dimeglumine (MAGNEVIST) injection 20 mL  20 mL Intravenous Once PRN Shantana Christon, Earlean Polka, MD  PHYSICAL EXAM  GENERAL EXAM/CONSTITUTIONAL: Vitals:  Vitals:   08/12/18 1413  BP: (!) 151/97  Pulse: 83  Weight: 180 lb (81.6 kg)  Height: 5' 2"  (1.575 m)     Body mass index is 32.92 kg/m. Wt Readings from Last 3 Encounters:  08/12/18 180 lb (81.6 kg)  01/26/18 178 lb 6.4 oz (80.9 kg)  07/29/17 177 lb 9.6 oz (80.6 kg)     Patient is in no distress; well developed, nourished and groomed; neck is supple  CARDIOVASCULAR:  Examination of carotid arteries is normal; no carotid bruits  Regular rate and rhythm, no murmurs  Examination of peripheral vascular system by observation and palpation is normal  EYES:  Ophthalmoscopic exam of optic discs and posterior segments is normal; no papilledema or hemorrhages  Visual Acuity Screening   Right eye Left eye Both eyes  Without correction:     With correction: 20/20 20/70      MUSCULOSKELETAL:  Gait, strength, tone, movements noted in Neurologic exam below  NEUROLOGIC: MENTAL STATUS:  No flowsheet data  found.  awake, alert, oriented to person, place and time  recent and remote memory intact  normal attention and concentration  language fluent, comprehension intact, naming intact  fund of knowledge appropriate  CRANIAL NERVE:   2nd - no papilledema on fundoscopic exam  2nd, 3rd, 4th, 6th - pupils equal and reactive to light, visual fields full to confrontation, extraocular muscles intact, no nystagmus  5th - facial sensation symmetric  7th - facial strength symmetric  8th - hearing intact  9th - palate elevates symmetrically, uvula midline  11th - shoulder shrug symmetric  12th - tongue protrusion midline  MOTOR:   normal bulk and tone, full strength in the BUE, BLE  SENSORY:   normal and symmetric to light touch, pinprick, temperature, vibration  COORDINATION:   finger-nose-finger, fine finger movements normal  REFLEXES:   deep tendon reflexes present and symmetric  GAIT/STATION:   narrow based gait; able to walk on toes, heels and tandem; romberg is negative      DIAGNOSTICS/IMAGING:  Lab Results  Component Value Date   WBC 3.6 01/20/2017   HGB 12.4 01/20/2017   HCT 36.5 01/20/2017   MCV 84 01/20/2017   PLT 263 01/20/2017   Lymphocytes Absolute  Date Value Ref Range Status  01/20/2017 0.6 (L) 0.7 - 3.1 x10E3/uL Final  10/12/2015 0.5 (L) 0.7 - 3.1 x10E3/uL Final  09/11/2015 0.3 (L) 0.7 - 3.1 x10E3/uL Final   Vit D, 25-Hydroxy  Date Value Ref Range Status  01/20/2017 39.8 30.0 - 100.0 ng/mL Final    Comment:    Vitamin D deficiency has been defined by the Institute of Medicine and an Endocrine Society practice guideline as a level of serum 25-OH vitamin D less than 20 ng/mL (1,2). The Endocrine Society went on to further define vitamin D insufficiency as a level between 21 and 29 ng/mL (2). 1. IOM (Institute of Medicine). 2010. Dietary reference    intakes for calcium and D. [place]: The    [employer]. 2. Holick MF,  Binkley [place], Bischoff-Ferrari HA, et al.    Evaluation, treatment, and prevention of vitamin D    deficiency: an Endocrine Society clinical practice    guideline. JCEM. 2011 Jul; 96(7):1911-30.     07/30/18 Labs WBC 4.8 4.5 - 11.0 10*9/L [facility]   RBC 4.57 4.00 - 5.20 10*12/L [facility]   HGB 13.4 12.0 - 16.0 g/dL [facility]   HCT 40.8 36.0 - 46.0 % [facility]   MCV 89.3 80.0 - 100.0 fL [facility]   MCH 29.3 26.0 - 34.0 pg [facility]   MCHC 32.8 31.0 - 37.0 g/dL [facility]   RDW 13.7 12.0 - 15.0 % [facility]   MPV 8.4 7.0 - 10.0 fL [facility]   Platelet 288 150 - 440 10*9/L [facility]   Neutrophils % 73.7 % [facility]   Lymphocytes % 11.4 % [facility]   Monocytes % 9.0 % [facility]   Eosinophils % 3.2 % [facility]   Basophils % 0.4 % [facility]   Absolute Neutrophils 3.6 2.0 - 7.5 10*9/L [facility]   Absolute Lymphocytes 0.6 (L) 1.5 - 5.0 10*9/L [facility]                             01/24/10 Lumbar puncture - WBC 1, RBC 1, IgG index 1.7 (h), Twelve (12) IgG bands were observed in the CSF. No such bands were detected in the serum sample.   01/24/10 labs - ANA, RF, ANCA, HIV, ESR   09/16/13 MRI brain  1. Few periventricular and subcortical chronic demyelinating plaques.  2. No acute plaques.  3. No significant change from MRI on 03/25/12.   03/02/14 MRI brain - MRI scan of the brain showing scattered subcortical and periventricular white matter hyperintense CT is compatible with patient's known diagnosis of multiple sclerosis. No enhancing lesions are  noted. There is incidental chronic severe paranasal sinus inflammatory disease noted.  09/12/15 JCV ab - negative  12/27/15 MRI brain (with and without contrast) demonstrating: 1. Few periventricular and subcortical chronic demyelinating plaques.  2. No acute plaques.  08/06/17 MRI brain (with and without) demonstrating: 1. There are a few small, round and ovoid, periventricular and subcortical chronic demyelinating plaques. 2. No acute plaques. 3. No significant change from MRI on 12/27/15.   ASSESSMENT: 47 y.o. female with paresthesias and swelling of her bilateral feet since Septemeber 2010, which have gradually improved. Diagnosed with multiple sclerosis by MRI brain, t-spine, labs and LP (12 OCBs). Was on rebif 04/27/10 until Feb 2012 (stopped b/c of possible rxns and also was trying to get pregnant). Now on gilenya and doing well.  Now with inflammatory bowel disease (crohn's dz), now on Stelara (ustekinumab; every 2 months pen injection at home).    Dx:  MS (multiple sclerosis) ([place])  Crohn's disease of large intestine with complication ([place])  Encounter for therapeutic drug monitoring    PLAN:   I spent 15 minutes of face to face time with patient. Greater than 50% of time was spent in counseling and coordination of care with patient. In summary we discussed: - Diagnostic results, impressions, or recommended diagnostic studies: MS and crohns; multiple medicatiosn - Prognosis: good - Risks and benefits of management (treatment) options: gilenya reviewed and lab monitoring  MULTIPLE SCLEROSIS (stable) - continue gilenya, vitamin D - continue monitoring: CBC, CMP (per GI or PCP) every 6-12 months - continue clinical monitoring  Return in about 9 months (around 05/13/2019).     Penni Bombard, MD 03/09/8308, 4:07 PM Certified in Neurology, Neurophysiology and Neuroimaging  [facility] [address], [place], [place] [address] [phone]

## 2018-08-13 DIAGNOSIS — M9902 Segmental and somatic dysfunction of thoracic region: Secondary | ICD-10-CM | POA: Diagnosis not present

## 2018-08-13 DIAGNOSIS — M9901 Segmental and somatic dysfunction of cervical region: Secondary | ICD-10-CM | POA: Diagnosis not present

## 2018-08-13 DIAGNOSIS — M9903 Segmental and somatic dysfunction of lumbar region: Secondary | ICD-10-CM | POA: Diagnosis not present

## 2018-08-25 DIAGNOSIS — H25812 Combined forms of age-related cataract, left eye: Secondary | ICD-10-CM | POA: Diagnosis not present

## 2018-08-25 DIAGNOSIS — H2512 Age-related nuclear cataract, left eye: Secondary | ICD-10-CM | POA: Diagnosis not present

## 2018-08-25 DIAGNOSIS — H25042 Posterior subcapsular polar age-related cataract, left eye: Secondary | ICD-10-CM | POA: Diagnosis not present

## 2018-08-26 DIAGNOSIS — H25041 Posterior subcapsular polar age-related cataract, right eye: Secondary | ICD-10-CM | POA: Diagnosis not present

## 2018-08-26 DIAGNOSIS — H25011 Cortical age-related cataract, right eye: Secondary | ICD-10-CM | POA: Diagnosis not present

## 2018-08-26 DIAGNOSIS — H2511 Age-related nuclear cataract, right eye: Secondary | ICD-10-CM | POA: Diagnosis not present

## 2018-09-01 DIAGNOSIS — H25811 Combined forms of age-related cataract, right eye: Secondary | ICD-10-CM | POA: Diagnosis not present

## 2018-09-01 DIAGNOSIS — H2511 Age-related nuclear cataract, right eye: Secondary | ICD-10-CM | POA: Diagnosis not present

## 2018-09-22 DIAGNOSIS — J019 Acute sinusitis, unspecified: Secondary | ICD-10-CM | POA: Diagnosis not present

## 2018-09-24 DIAGNOSIS — M9903 Segmental and somatic dysfunction of lumbar region: Secondary | ICD-10-CM | POA: Diagnosis not present

## 2018-09-24 DIAGNOSIS — M9902 Segmental and somatic dysfunction of thoracic region: Secondary | ICD-10-CM | POA: Diagnosis not present

## 2018-09-24 DIAGNOSIS — M9901 Segmental and somatic dysfunction of cervical region: Secondary | ICD-10-CM | POA: Diagnosis not present

## 2018-10-16 DIAGNOSIS — M9901 Segmental and somatic dysfunction of cervical region: Secondary | ICD-10-CM | POA: Diagnosis not present

## 2018-10-16 DIAGNOSIS — M9903 Segmental and somatic dysfunction of lumbar region: Secondary | ICD-10-CM | POA: Diagnosis not present

## 2018-10-16 DIAGNOSIS — M9902 Segmental and somatic dysfunction of thoracic region: Secondary | ICD-10-CM | POA: Diagnosis not present

## 2018-11-09 DIAGNOSIS — H26492 Other secondary cataract, left eye: Secondary | ICD-10-CM | POA: Diagnosis not present

## 2018-11-16 DIAGNOSIS — M9903 Segmental and somatic dysfunction of lumbar region: Secondary | ICD-10-CM | POA: Diagnosis not present

## 2018-11-16 DIAGNOSIS — M9901 Segmental and somatic dysfunction of cervical region: Secondary | ICD-10-CM | POA: Diagnosis not present

## 2018-11-16 DIAGNOSIS — M9902 Segmental and somatic dysfunction of thoracic region: Secondary | ICD-10-CM | POA: Diagnosis not present

## 2018-11-23 DIAGNOSIS — Z961 Presence of intraocular lens: Secondary | ICD-10-CM | POA: Diagnosis not present

## 2018-11-23 DIAGNOSIS — H26491 Other secondary cataract, right eye: Secondary | ICD-10-CM | POA: Diagnosis not present

## 2018-12-07 DIAGNOSIS — H26491 Other secondary cataract, right eye: Secondary | ICD-10-CM | POA: Diagnosis not present

## 2018-12-16 DIAGNOSIS — M9903 Segmental and somatic dysfunction of lumbar region: Secondary | ICD-10-CM | POA: Diagnosis not present

## 2018-12-16 DIAGNOSIS — M9902 Segmental and somatic dysfunction of thoracic region: Secondary | ICD-10-CM | POA: Diagnosis not present

## 2018-12-16 DIAGNOSIS — M9901 Segmental and somatic dysfunction of cervical region: Secondary | ICD-10-CM | POA: Diagnosis not present

## 2019-01-01 DIAGNOSIS — I10 Essential (primary) hypertension: Secondary | ICD-10-CM | POA: Diagnosis not present

## 2019-01-01 DIAGNOSIS — R42 Dizziness and giddiness: Secondary | ICD-10-CM | POA: Diagnosis not present

## 2019-01-01 DIAGNOSIS — R197 Diarrhea, unspecified: Secondary | ICD-10-CM | POA: Diagnosis not present

## 2019-01-06 DIAGNOSIS — H11441 Conjunctival cysts, right eye: Secondary | ICD-10-CM | POA: Diagnosis not present

## 2019-01-14 DIAGNOSIS — R05 Cough: Secondary | ICD-10-CM | POA: Diagnosis not present

## 2019-01-14 DIAGNOSIS — B349 Viral infection, unspecified: Secondary | ICD-10-CM | POA: Diagnosis not present

## 2019-01-15 DIAGNOSIS — M9902 Segmental and somatic dysfunction of thoracic region: Secondary | ICD-10-CM | POA: Diagnosis not present

## 2019-01-15 DIAGNOSIS — M9903 Segmental and somatic dysfunction of lumbar region: Secondary | ICD-10-CM | POA: Diagnosis not present

## 2019-01-15 DIAGNOSIS — M9901 Segmental and somatic dysfunction of cervical region: Secondary | ICD-10-CM | POA: Diagnosis not present

## 2019-01-17 DIAGNOSIS — J019 Acute sinusitis, unspecified: Secondary | ICD-10-CM | POA: Diagnosis not present

## 2019-02-05 DIAGNOSIS — M9903 Segmental and somatic dysfunction of lumbar region: Secondary | ICD-10-CM | POA: Diagnosis not present

## 2019-02-05 DIAGNOSIS — M9901 Segmental and somatic dysfunction of cervical region: Secondary | ICD-10-CM | POA: Diagnosis not present

## 2019-02-05 DIAGNOSIS — M9902 Segmental and somatic dysfunction of thoracic region: Secondary | ICD-10-CM | POA: Diagnosis not present

## 2019-03-10 DIAGNOSIS — M9902 Segmental and somatic dysfunction of thoracic region: Secondary | ICD-10-CM | POA: Diagnosis not present

## 2019-03-10 DIAGNOSIS — M9901 Segmental and somatic dysfunction of cervical region: Secondary | ICD-10-CM | POA: Diagnosis not present

## 2019-03-10 DIAGNOSIS — M9903 Segmental and somatic dysfunction of lumbar region: Secondary | ICD-10-CM | POA: Diagnosis not present

## 2019-03-25 DIAGNOSIS — H4612 Retrobulbar neuritis, left eye: Secondary | ICD-10-CM | POA: Diagnosis not present

## 2019-03-25 DIAGNOSIS — H11441 Conjunctival cysts, right eye: Secondary | ICD-10-CM | POA: Diagnosis not present

## 2019-03-25 DIAGNOSIS — G35 Multiple sclerosis: Secondary | ICD-10-CM | POA: Diagnosis not present

## 2019-04-14 ENCOUNTER — Telehealth: Payer: Self-pay | Admitting: *Deleted

## 2019-04-14 ENCOUNTER — Encounter: Payer: Self-pay | Admitting: *Deleted

## 2019-04-14 DIAGNOSIS — M9903 Segmental and somatic dysfunction of lumbar region: Secondary | ICD-10-CM | POA: Diagnosis not present

## 2019-04-14 DIAGNOSIS — M9902 Segmental and somatic dysfunction of thoracic region: Secondary | ICD-10-CM | POA: Diagnosis not present

## 2019-04-14 DIAGNOSIS — M9901 Segmental and somatic dysfunction of cervical region: Secondary | ICD-10-CM | POA: Diagnosis not present

## 2019-04-14 NOTE — Telephone Encounter (Signed)
Spoke with patient and updated EMR. 

## 2019-04-15 ENCOUNTER — Ambulatory Visit (INDEPENDENT_AMBULATORY_CARE_PROVIDER_SITE_OTHER): Payer: 59 | Admitting: Diagnostic Neuroimaging

## 2019-04-15 ENCOUNTER — Encounter: Payer: Self-pay | Admitting: Diagnostic Neuroimaging

## 2019-04-15 ENCOUNTER — Other Ambulatory Visit: Payer: Self-pay

## 2019-04-15 DIAGNOSIS — K50119 Crohn's disease of large intestine with unspecified complications: Secondary | ICD-10-CM | POA: Diagnosis not present

## 2019-04-15 DIAGNOSIS — G35 Multiple sclerosis: Secondary | ICD-10-CM | POA: Diagnosis not present

## 2019-04-15 NOTE — Progress Notes (Signed)
    Virtual Visit via Video Note  I connected with Ashley Diaz on 04/15/19 at  8:30 AM EDT by a video enabled telemedicine application and verified that I am speaking with the correct person using two identifiers.   I discussed the limitations of evaluation and management by telemedicine and the availability of in person appointments. The patient expressed understanding and agreed to proceed.  Patient is at their home. I am at the office.    History of Present Illness:  UPDATE (04/15/19, VRP): Since last visit, doing well. Symptoms are stable. No new issues. No alleviating or aggravating factors. Tolerating gilenya. S/p cataract procedure. Crohn's is stable; on stellara. Annual physical is pending. Needs follow up labs.    Observations/Objective:  VIDEO EXAM  GENERAL EXAM/CONSTITUTIONAL:  Vitals: There were no vitals filed for this visit.  There is no height or weight on file to calculate BMI. Wt Readings from Last 3 Encounters:  08/12/18 180 lb (81.6 kg)  01/26/18 178 lb 6.4 oz (80.9 kg)  07/29/17 177 lb 9.6 oz (80.6 kg)     Patient is in no distress; well developed, nourished and groomed; neck is supple   NEUROLOGIC: MENTAL STATUS:  No flowsheet data found.  awake, alert, oriented to person, place and time  recent and remote memory intact  normal attention and concentration  language fluent, comprehension intact, naming intact  fund of knowledge appropriate  CRANIAL NERVE:   2nd, 3rd, 4th, 6th - visual fields full to confrontation, extraocular muscles intact, no nystagmus  5th - facial sensation symmetric  7th - facial strength symmetric  8th - hearing intact  11th - shoulder shrug symmetric  12th - tongue protrusion midline  MOTOR:   NO TREMOR; NO DRIFT IN BUE  SENSORY:   normal and symmetric to light touch  COORDINATION:   fine finger movements normal    Assessment and Plan:  Dx:  1. MS (multiple sclerosis) ([place])   2. Crohn's  disease of large intestine with complication ([place])      MULTIPLE SCLEROSIS (stable) - continue gilenya, vitamin D - continue monitoring: CBC, CMP (per GI or PCP) every 6-12 months - continue clinical monitoring   Follow Up Instructions:  - Return in about 8 months (around 12/16/2019).    I discussed the assessment and treatment plan with the patient. The patient was provided an opportunity to ask questions and all were answered. The patient agreed with the plan and demonstrated an understanding of the instructions.   The patient was advised to call back or seek an in-person evaluation if the symptoms worsen or if the condition fails to improve as anticipated.  I provided 15 minutes of non-face-to-face time during this encounter.   Penni Bombard, MD 7/40/8144, 8:18 AM Certified in Neurology, Neurophysiology and Neuroimaging  [facility] [address] [place], [place] [address] [phone]

## 2019-05-17 ENCOUNTER — Ambulatory Visit: Payer: 59 | Admitting: Diagnostic Neuroimaging

## 2019-07-08 ENCOUNTER — Telehealth: Payer: Self-pay | Admitting: *Deleted

## 2019-07-08 DIAGNOSIS — Z5181 Encounter for therapeutic drug level monitoring: Secondary | ICD-10-CM

## 2019-07-08 DIAGNOSIS — G35 Multiple sclerosis: Secondary | ICD-10-CM

## 2019-07-08 NOTE — Telephone Encounter (Signed)
Called patient to follow up on my chart sent 07/01/19. She had not read my chart. I informed her that her lymphocytes are low, so Dr Leta Baptist wants her to decrease Gilenya and take one tab every other day.  She will come in one month for a repeat lab. She stated she set her phone to come on 08/11/19,  verbalized understanding, appreciation.

## 2019-08-05 ENCOUNTER — Encounter: Payer: Self-pay | Admitting: *Deleted

## 2019-08-05 NOTE — Addendum Note (Signed)
Addended by: Minna Antis on: 08/05/2019 02:19 PM   Modules accepted: Orders

## 2019-08-05 NOTE — Telephone Encounter (Signed)
Sent my chart reminding her of repeat CBC, CMP. I advised if she has recently had these labs at GI or PCP they can fax results to Dr Leta Baptist instead of her coming to our office.

## 2019-08-11 ENCOUNTER — Other Ambulatory Visit: Payer: Self-pay | Admitting: Diagnostic Neuroimaging

## 2019-08-11 ENCOUNTER — Other Ambulatory Visit (INDEPENDENT_AMBULATORY_CARE_PROVIDER_SITE_OTHER): Payer: Self-pay

## 2019-08-11 ENCOUNTER — Encounter: Payer: Self-pay | Admitting: *Deleted

## 2019-08-11 DIAGNOSIS — G35 Multiple sclerosis: Secondary | ICD-10-CM

## 2019-08-11 DIAGNOSIS — Z0289 Encounter for other administrative examinations: Secondary | ICD-10-CM

## 2019-08-11 DIAGNOSIS — Z5181 Encounter for therapeutic drug level monitoring: Secondary | ICD-10-CM

## 2019-08-12 LAB — CBC WITH DIFFERENTIAL/PLATELET
Basophils Absolute: 0 10*3/uL (ref 0.0–0.2)
Basos: 0 %
EOS (ABSOLUTE): 0.1 10*3/uL (ref 0.0–0.4)
Eos: 2 %
Hematocrit: 40.1 % (ref 34.0–46.6)
Hemoglobin: 13.6 g/dL (ref 11.1–15.9)
Immature Grans (Abs): 0 10*3/uL (ref 0.0–0.1)
Immature Granulocytes: 0 %
Lymphocytes Absolute: 0.3 10*3/uL — ABNORMAL LOW (ref 0.7–3.1)
Lymphs: 6 %
MCH: 29.6 pg (ref 26.6–33.0)
MCHC: 33.9 g/dL (ref 31.5–35.7)
MCV: 87 fL (ref 79–97)
Monocytes Absolute: 0.6 10*3/uL (ref 0.1–0.9)
Monocytes: 11 %
Neutrophils Absolute: 4 10*3/uL (ref 1.4–7.0)
Neutrophils: 81 %
Platelets: 289 10*3/uL (ref 150–450)
RBC: 4.6 x10E6/uL (ref 3.77–5.28)
RDW: 12.7 % (ref 11.7–15.4)
WBC: 5 10*3/uL (ref 3.4–10.8)

## 2019-08-12 LAB — COMPREHENSIVE METABOLIC PANEL
ALT: 31 IU/L (ref 0–32)
AST: 19 IU/L (ref 0–40)
Albumin/Globulin Ratio: 1.7 (ref 1.2–2.2)
Albumin: 4.5 g/dL (ref 3.8–4.8)
Alkaline Phosphatase: 84 IU/L (ref 39–117)
BUN/Creatinine Ratio: 14 (ref 9–23)
BUN: 10 mg/dL (ref 6–24)
Bilirubin Total: 0.2 mg/dL (ref 0.0–1.2)
CO2: 24 mmol/L (ref 20–29)
Calcium: 10.2 mg/dL (ref 8.7–10.2)
Chloride: 103 mmol/L (ref 96–106)
Creatinine, Ser: 0.71 mg/dL (ref 0.57–1.00)
GFR calc Af Amer: 118 mL/min/{1.73_m2} (ref >59)
GFR calc non Af Amer: 102 mL/min/{1.73_m2} (ref >59)
Globulin, Total: 2.6 g/dL (ref 1.5–4.5)
Glucose: 94 mg/dL (ref 65–99)
Potassium: 4 mmol/L (ref 3.5–5.2)
Sodium: 140 mmol/L (ref 134–144)
Total Protein: 7.1 g/dL (ref 6.0–8.5)

## 2019-08-16 NOTE — Telephone Encounter (Signed)
Continue gilenya 0.105m every other day. -VRP

## 2019-08-16 NOTE — Telephone Encounter (Signed)
Called patient and advised her per Dr Leta Baptist to continue taking Gilenys 0.5 mg every other day because her lymphocyte count is better but still low. She  verbalized understanding, appreciation.

## 2019-10-07 ENCOUNTER — Encounter: Payer: Self-pay | Admitting: *Deleted

## 2019-12-13 ENCOUNTER — Other Ambulatory Visit: Payer: Self-pay

## 2019-12-13 ENCOUNTER — Encounter: Payer: Self-pay | Admitting: Diagnostic Neuroimaging

## 2019-12-13 ENCOUNTER — Ambulatory Visit (INDEPENDENT_AMBULATORY_CARE_PROVIDER_SITE_OTHER): Payer: 59 | Admitting: Diagnostic Neuroimaging

## 2019-12-13 VITALS — BP 147/94 | HR 86 | Temp 98.8°F | Ht 61.0 in | Wt 168.2 lb

## 2019-12-13 DIAGNOSIS — G35 Multiple sclerosis: Secondary | ICD-10-CM | POA: Diagnosis not present

## 2019-12-13 DIAGNOSIS — Z5181 Encounter for therapeutic drug level monitoring: Secondary | ICD-10-CM | POA: Diagnosis not present

## 2019-12-13 NOTE — Progress Notes (Signed)
Chief Complaint  Patient presents with  . Multiple Sclerosis    rm 7, 8 month FU, "some tiredness this week, stress from going back to work; want new Rx for tizanidine"   History of Present Illness:  UPDATE (12/13/19, VRP): Since last visit, more stress / anxiety (mother in law living with them; work from home; pandemic) --> now more insomnia, right leg numbness, back spasms. Tolerating gilenya.   UPDATE (04/15/19, VRP): Since last visit, doing well. Symptoms are stable. No new issues. No alleviating or aggravating factors. Tolerating gilenya. S/p cataract procedure. Crohn's is stable; on stellara. Annual physical is pending. Needs follow up labs.    Observations/Objective:  GENERAL EXAM/CONSTITUTIONAL: Vitals:  Vitals:   12/13/19 1556  BP: (!) 147/94  Pulse: 86  Temp: 98.8 F (37.1 C)  Weight: 168 lb 3.2 oz (76.3 kg)  Height: 5' 1"  (1.549 m)     Body mass index is 31.78 kg/m. Wt Readings from Last 3 Encounters:  12/13/19 168 lb 3.2 oz (76.3 kg)  08/12/18 180 lb (81.6 kg)  01/26/18 178 lb 6.4 oz (80.9 kg)     Patient is in no distress; well developed, nourished and groomed; neck is supple  CARDIOVASCULAR:  Examination of carotid arteries is normal; no carotid bruits  Regular rate and rhythm, no murmurs  Examination of peripheral vascular system by observation and palpation is normal  EYES:  Ophthalmoscopic exam of optic discs and posterior segments is normal; no papilledema or hemorrhages  No exam data present  MUSCULOSKELETAL:  Gait, strength, tone, movements noted in Neurologic exam below  NEUROLOGIC: MENTAL STATUS:  No flowsheet data found.  awake, alert, oriented to person, place and time  recent and remote memory intact  normal attention and concentration  language fluent, comprehension intact, naming intact  fund of knowledge appropriate  CRANIAL NERVE:   2nd - no papilledema on fundoscopic exam  2nd, 3rd, 4th, 6th - pupils equal  and reactive to light, visual fields full to confrontation, extraocular muscles intact, no nystagmus  5th - facial sensation symmetric  7th - facial strength symmetric  8th - hearing intact  9th - palate elevates symmetrically, uvula midline  11th - shoulder shrug symmetric  12th - tongue protrusion midline  MOTOR:   normal bulk and tone, full strength in the BUE, BLE  SENSORY:   normal and symmetric to light touch  COORDINATION:   finger-nose-finger, fine finger movements normal  REFLEXES:   deep tendon reflexes present and symmetric  GAIT/STATION:   narrow based gait    Assessment and Plan:  49 y.o. year old female with multiple sclerosis. Paresthesias and swelling of her bilateral feet since Septemeber 2010, which have gradually improved. Diagnosed with multiple sclerosis by MRI brain, t-spine, labs and LP (12 OCBs). Was on rebif 04/27/10 until Feb 2012 (stopped b/c of possible rxns and also was trying to get pregnant). Now on gilenya and doing well. Also with inflammatory bowel disease (crohn's dz), now on Stelara (ustekinumab; every 2 months pen injection at home).   08/06/17 MRI brain (with and without) demonstrating: 1. There are a few small, round and ovoid, periventricular and subcortical chronic demyelinating plaques. 2. No acute plaques. 3. No significant change from MRI on 12/27/15.  Dx:  1. MS (multiple sclerosis) ([place])   2. Encounter for therapeutic drug monitoring      MULTIPLE SCLEROSIS (stable) - continue gilenya 0.8m every other day - continue vitamin D - continue monitoring: CBC, CMP (per GI  or PCP) every 6-12 months - continue clinical monitoring   Follow Up Instructions:  - Return in about 9 months (around 09/11/2020).   I spent 25 minutes of face-to-face and non-face-to-face time with patient.  This included previsit chart review, lab review, study review, order entry, electronic health record documentation, patient education.        Penni Bombard, MD 4/49/6759, 1:63 PM Certified in Neurology, Neurophysiology and Neuroimaging  [facility] [address] [place], [place] [address] [phone]

## 2020-01-13 ENCOUNTER — Telehealth: Payer: Self-pay | Admitting: Neurology

## 2020-01-13 MED ORDER — TIZANIDINE HCL 2 MG PO TABS: 2.0000 mg | ORAL_TABLET | Freq: Three times a day (TID) | ORAL | 0 refills | Status: DC

## 2020-01-13 MED ORDER — PREDNISONE 5 MG PO TABS: ORAL_TABLET | ORAL | 0 refills | Status: DC

## 2020-01-13 NOTE — Telephone Encounter (Signed)
I called the patient.  Patient has a history of multiple sclerosis.  She has had episodes of back spasms in the past and has had some change in tension in the back over the last several days with onset of spasms last evening.  The pain seems to come around from the back to the front on the sides.  She denies any pain going down the legs on either side, she denies changes in strength or sensation of the legs or changes in bowel bladder function.  She has not had any signs of a bladder infection such as urinary frequency or change in color of the urine or foul odor of the urine.  The patient denies any fevers or chills.  In the past, she has used tizanidine for her back spasms.  I will send in a prescription for tizanidine and a 6-day prednisone Dosepak.  If her symptoms worsen, it is possible this may be related to a MS exacerbation.  She will contact our office.

## 2020-03-27 ENCOUNTER — Telehealth: Payer: Self-pay | Admitting: *Deleted

## 2020-03-27 NOTE — Telephone Encounter (Signed)
Received via my chart lab results per PCP. Placed on Dr [employer] desk for review: CBC w/diff, CMP, CRP, Ferritin, Vit B12, iron panel.

## 2020-03-28 NOTE — Telephone Encounter (Signed)
Sent my chart: results good except low lymphocytes. She is taking Gilenya. Dr Leta Baptist will continue to monitor with her regular lab draws via PCP.

## 2020-04-10 ENCOUNTER — Ambulatory Visit: Payer: 59 | Admitting: Diagnostic Neuroimaging

## 2020-04-11 ENCOUNTER — Encounter: Payer: Self-pay | Admitting: *Deleted

## 2020-08-15 ENCOUNTER — Other Ambulatory Visit: Payer: Self-pay | Admitting: *Deleted

## 2020-08-15 MED ORDER — GILENYA 0.5 MG PO CAPS: 0.5000 mg | ORAL_CAPSULE | Freq: Every day | ORAL | 11 refills | Status: DC

## 2020-08-21 ENCOUNTER — Telehealth: Payer: Self-pay | Admitting: *Deleted

## 2020-08-21 NOTE — Telephone Encounter (Signed)
[facility] to discuss gilenya dosing, spoke with pharmacist, Janett Billow and advised her that patient is still on gilenya one tab every other day. She has follow up on 09/11/20, will have repeat labs to check lymphocytes. If MD increases Gilenya to one tab daily a new Rx will be sent in. She updated their Rx, left 11 refills on file, verbalized understanding, appreciation.

## 2020-09-11 ENCOUNTER — Ambulatory Visit (INDEPENDENT_AMBULATORY_CARE_PROVIDER_SITE_OTHER): Payer: 59 | Admitting: Diagnostic Neuroimaging

## 2020-09-11 ENCOUNTER — Encounter: Payer: Self-pay | Admitting: Diagnostic Neuroimaging

## 2020-09-11 VITALS — BP 163/97 | HR 96 | Ht 61.0 in | Wt 176.6 lb

## 2020-09-11 DIAGNOSIS — G35 Multiple sclerosis: Secondary | ICD-10-CM

## 2020-09-11 DIAGNOSIS — K50119 Crohn's disease of large intestine with unspecified complications: Secondary | ICD-10-CM

## 2020-09-11 DIAGNOSIS — Z5181 Encounter for therapeutic drug level monitoring: Secondary | ICD-10-CM | POA: Diagnosis not present

## 2020-09-11 MED ORDER — TIZANIDINE HCL 2 MG PO TABS: 2.0000 mg | ORAL_TABLET | Freq: Two times a day (BID) | ORAL | 1 refills | Status: DC | PRN

## 2020-09-11 NOTE — Progress Notes (Signed)
Chief Complaint  Patient presents with  . Multiple Sclerosis    rm 7, 9 month FU  "no new concerns; do I need Covid booster?   History of Present Illness:  UPDATE (09/11/20 , VRP): Since last visit, doing well. Symptoms are stable. Severity is mild. No alleviating or aggravating factors. Tolerating meds. Lymphocytes in April 2021 were low again (0.2).   UPDATE (12/13/19, VRP): Since last visit, more stress / anxiety (mother in law living with them; work from home; pandemic) --> now more insomnia, right leg numbness, back spasms. Tolerating gilenya.   UPDATE (04/15/19, VRP): Since last visit, doing well. Symptoms are stable. No new issues. No alleviating or aggravating factors. Tolerating gilenya. S/p cataract procedure. Crohn's is stable; on stellara. Annual physical is pending. Needs follow up labs.    Observations/Objective:  GENERAL EXAM/CONSTITUTIONAL: Vitals:  Vitals:   09/11/20 1503  BP: (!) 163/97  Pulse: 96  Weight: 176 lb 9.6 oz (80.1 kg)  Height: 5' 1"  (1.549 m)   Body mass index is 33.37 kg/m. Wt Readings from Last 3 Encounters:  09/11/20 176 lb 9.6 oz (80.1 kg)  12/13/19 168 lb 3.2 oz (76.3 kg)  08/12/18 180 lb (81.6 kg)    Patient is in no distress; well developed, nourished and groomed; neck is supple  CARDIOVASCULAR:  Examination of carotid arteries is normal; no carotid bruits  Regular rate and rhythm, no murmurs  Examination of peripheral vascular system by observation and palpation is normal  EYES:  Ophthalmoscopic exam of optic discs and posterior segments is normal; no papilledema or hemorrhages No exam data present  MUSCULOSKELETAL:  Gait, strength, tone, movements noted in Neurologic exam below  NEUROLOGIC: MENTAL STATUS:  No flowsheet data found.  awake, alert, oriented to person, place and time  recent and remote memory intact  normal attention and concentration  language fluent, comprehension intact, naming intact  fund of  knowledge appropriate  CRANIAL NERVE:   2nd - no papilledema on fundoscopic exam  2nd, 3rd, 4th, 6th - pupils equal and reactive to light, visual fields full to confrontation, extraocular muscles intact, no nystagmus  5th - facial sensation symmetric  7th - facial strength symmetric  8th - hearing intact  9th - palate elevates symmetrically, uvula midline  11th - shoulder shrug symmetric  12th - tongue protrusion midline  MOTOR:   normal bulk and tone, full strength in the BUE, BLE  SENSORY:   normal and symmetric to light touch  COORDINATION:   finger-nose-finger, fine finger movements normal  REFLEXES:   deep tendon reflexes present and symmetric  GAIT/STATION:   narrow based gait  Lab Results  Component Value Date   WBC 5.0 08/11/2019   HGB 13.6 08/11/2019   HCT 40.1 08/11/2019   MCV 87 08/11/2019   PLT 289 08/11/2019      Assessment and Plan:  49 y.o. year old female with multiple sclerosis. Paresthesias and swelling of her bilateral feet since Septemeber 2010, which have gradually improved. Diagnosed with multiple sclerosis by MRI brain, t-spine, labs and LP (12 OCBs). Was on rebif 04/27/10 until Feb 2012 (stopped b/c of possible rxns and also was trying to get pregnant). Now on gilenya and doing well. Also with inflammatory bowel disease (crohn's dz), now on Stelara (ustekinumab; every 2 months pen injection at home).    08/06/17 MRI brain (with and without) demonstrating: 1. There are a few small, round and ovoid, periventricular and subcortical chronic demyelinating plaques. 2. No acute  plaques. 3. No significant change from MRI on 12/27/15.  Dx:  1. MS (multiple sclerosis) ([place])   2. Encounter for therapeutic drug monitoring   3. Crohn's disease of large intestine with complication ([place])      MULTIPLE SCLEROSIS (stable) - check MRI brain  - plan to stop gilenya (due to low lymphocyte counts in April 2021 --> 0.2) --> consider copaxone or  rebif (safer due to concurrent stelara usage); may consider tysabri if stelara is discontinued; may consider second opinion - continue vitamin D - continue monitoring: CBC, CMP (per GI or PCP) every 6-12 months - continue clinical monitoring  Orders Placed This Encounter  Procedures  . MR BRAIN W WO CONTRAST  . CBC with Differential/Platelet   Meds ordered this encounter  Medications  . tiZANidine (ZANAFLEX) 2 MG tablet    Sig: Take 1-2 tablets (2-4 mg total) by mouth 2 (two) times daily as needed for muscle spasms.    Dispense:  30 tablet    Refill:  1    Follow Up Instructions:  - Return in about 6 months (around 03/12/2021).       Penni Bombard, MD 77/82/4235, 3:61 PM Certified in Neurology, Neurophysiology and Neuroimaging  [facility] [address], [place], [place] [address] [phone]

## 2020-09-12 ENCOUNTER — Telehealth: Payer: Self-pay | Admitting: *Deleted

## 2020-09-12 LAB — CBC WITH DIFFERENTIAL/PLATELET
Basophils Absolute: 0 10*3/uL (ref 0.0–0.2)
Basos: 0 %
EOS (ABSOLUTE): 0.1 10*3/uL (ref 0.0–0.4)
Eos: 3 %
Hematocrit: 38.9 % (ref 34.0–46.6)
Hemoglobin: 13.4 g/dL (ref 11.1–15.9)
Immature Grans (Abs): 0 10*3/uL (ref 0.0–0.1)
Immature Granulocytes: 0 %
Lymphocytes Absolute: 0.2 10*3/uL — ABNORMAL LOW (ref 0.7–3.1)
Lymphs: 5 %
MCH: 30 pg (ref 26.6–33.0)
MCHC: 34.4 g/dL (ref 31.5–35.7)
MCV: 87 fL (ref 79–97)
Monocytes Absolute: 0.5 10*3/uL (ref 0.1–0.9)
Monocytes: 11 %
Neutrophils Absolute: 3.5 10*3/uL (ref 1.4–7.0)
Neutrophils: 81 %
Platelets: 242 10*3/uL (ref 150–450)
RBC: 4.47 x10E6/uL (ref 3.77–5.28)
RDW: 12.2 % (ref 11.7–15.4)
WBC: 4.3 10*3/uL (ref 3.4–10.8)

## 2020-09-12 NOTE — Telephone Encounter (Signed)
Called patient and informed her that her lab showed lower lymphocytes. Dr Leta Baptist advised she stop Gilenya, will start Copaxone. She agreed,  will come tomorrow to sign start form.

## 2020-09-13 ENCOUNTER — Telehealth: Payer: Self-pay | Admitting: Diagnostic Neuroimaging

## 2020-09-13 NOTE — Telephone Encounter (Signed)
Patient came in, signed Copaxone start form. She stated she called her insurance and was told the generic was fully covered. She will wait and see if insurance covers Copaxone.

## 2020-09-13 NOTE — Telephone Encounter (Signed)
no to the covid questions MR Brain w/wo contrast Dr. Leta Baptist [employer] Auth: [place] via [employer] website. Patient is scheduled at [facility] for 09/20/20.

## 2020-09-14 NOTE — Telephone Encounter (Signed)
Patient aware of MD result note and plans: Persistent lymphopenia, on gilenya every other day. Stop gilenya. Start copaxone.

## 2020-09-18 NOTE — Telephone Encounter (Signed)
Faxed start form for copaxone to [employer]. Received a receipt of confirmation.

## 2020-09-20 ENCOUNTER — Ambulatory Visit (INDEPENDENT_AMBULATORY_CARE_PROVIDER_SITE_OTHER): Payer: 59

## 2020-09-20 DIAGNOSIS — G35 Multiple sclerosis: Secondary | ICD-10-CM

## 2020-09-20 MED ORDER — GADOBENATE DIMEGLUMINE 529 MG/ML IV SOLN
15.0000 mL | Freq: Once | INTRAVENOUS | Status: AC | PRN
  Administered 2020-09-20: 15 mL via INTRAVENOUS

## 2020-09-21 ENCOUNTER — Encounter: Payer: Self-pay | Admitting: *Deleted

## 2020-09-21 NOTE — Telephone Encounter (Signed)
New Start form for Glatiramer Acetate Rx from [employer] filled out, signed by Dr Leta Baptist. I called patient to advise her and asked her to come in to sign new start form. She stated she received a call today from a pharmacy and was told they shipped Copaxone to her. It is due to arrive tomorrow. She has no idea who called or the name of the pharmacy but said it was not [facility]. I advised she wait and see if she gets a shipment of Copaxone. She will call Monday to update, verbalized understanding, appreciation. Will inform Cyril Mourning, [place] co-ordinator on Monday.

## 2020-09-21 NOTE — Telephone Encounter (Signed)
[facility] [place]) called, Insurance excludes Copaxone. Would like to know how physician would like to precede. Can fax generic prescription: [phone].

## 2020-09-25 ENCOUNTER — Telehealth: Payer: Self-pay | Admitting: *Deleted

## 2020-09-25 NOTE — Telephone Encounter (Signed)
Received via my chart: I did receive my auto inject...not meds. That is who the call came from that was delivered today. Just let me know when I should come in to sign the papers.  I replied and advised her of office hours.

## 2020-09-25 NOTE — Telephone Encounter (Signed)
Received my chart today: I just received a call from [facility] from [employer] dealing with Copaxone and she told me it has already been sent to [facility]. I told her my insurance wasn't going to cover it and we will trying to the generic. She said for you guys to try and appeal it or get prior authorization. Called [employer], spoke with Rocky Morel and advised of message I received from [facility] re: Copaxone not approved, fax generic Rx. She stated that they have co pay assistance for commercial insurance patients who are approved for Copaxone. They can receive up to $12000.00 a year co pay assistance ($5000 Jan-Feb, $ 2500 Mar-Dec not to exceed $12000/yr. Called [facility] pharmacy help line, spoke with Apolonio Schneiders to start PA. She sent PA over for review, will receive determination in 3-5 days by fax.  Called patient and updated her. She will go ahead and come tomorrow to sign [employer] papers for generic in the event [facility] doesn't approve Copaxone. Patient verbalized understanding, appreciation.

## 2020-09-26 ENCOUNTER — Encounter: Payer: Self-pay | Admitting: *Deleted

## 2020-09-26 NOTE — Telephone Encounter (Signed)
Receive fax from [facility], Copaxone denied, not a covered drug in her plan. She'll come today and sign [employer] forms for Glatiramer Acetate.

## 2020-09-26 NOTE — Telephone Encounter (Signed)
Glatiramer RX faxed to [employer]. Received a receipt of confirmation.

## 2020-09-26 NOTE — Telephone Encounter (Signed)
Sent her [employer] glatiramer Acetate start form from [employer] via my chart. She signed, sent back. Given to [employer] to process Rx.

## 2020-10-03 NOTE — Telephone Encounter (Signed)
I called pt. Her glatiramer shipment should be coming  Friday or Saturday. I will check with her next week to ensure she received it and is tolerating the injections well.

## 2020-10-09 NOTE — Telephone Encounter (Signed)
I called pt. She received the glatiramer RX and auto-injector. She will let us know if she has any questions or concerns.

## 2021-02-12 ENCOUNTER — Telehealth: Payer: Self-pay | Admitting: Diagnostic Neuroimaging

## 2021-02-12 NOTE — Telephone Encounter (Signed)
Called patient who stated her nerve pain occurred year ago but it resolved. It returned a few months ago. She has other pain that varies in the areas affected, sometimes in her joints, skin of hands feels irritated to touch. Stated she had not had pain like this before she got on glatiramer acetate, taking it since 10/16/20. She get bruises and red itching spots, the med is painful to her. I rescheduled her 6 month FU to tomorrow. She understands to bring new insurance card, arrive early for check in. Patient verbalized understanding, appreciation.

## 2021-02-12 NOTE — Telephone Encounter (Signed)
Pt has called to report that she hates Glatiramer Acetate 40 MG/ML SOSY.  Pt states she is in pain all the time, aching.  Pt feels her fingers feel like they are swollen even though they are not.  Pt also mentioned nerve pain from her buttocks to her private area and to her right foot.  Please call pt

## 2021-02-13 ENCOUNTER — Ambulatory Visit (INDEPENDENT_AMBULATORY_CARE_PROVIDER_SITE_OTHER): Payer: 59 | Admitting: Diagnostic Neuroimaging

## 2021-02-13 ENCOUNTER — Encounter: Payer: Self-pay | Admitting: Diagnostic Neuroimaging

## 2021-02-13 VITALS — BP 156/91 | HR 89 | Ht 61.75 in | Wt 177.2 lb

## 2021-02-13 DIAGNOSIS — G35 Multiple sclerosis: Secondary | ICD-10-CM | POA: Diagnosis not present

## 2021-02-13 DIAGNOSIS — M2550 Pain in unspecified joint: Secondary | ICD-10-CM

## 2021-02-13 DIAGNOSIS — Z5181 Encounter for therapeutic drug level monitoring: Secondary | ICD-10-CM | POA: Diagnosis not present

## 2021-02-13 MED ORDER — GABAPENTIN 300 MG PO CAPS: 300.0000 mg | ORAL_CAPSULE | Freq: Two times a day (BID) | ORAL | 3 refills | Status: DC

## 2021-02-13 NOTE — Patient Instructions (Addendum)
MULTIPLE SCLEROSIS - plan to stop copaxone; may consider tysabri if stelara is discontinued - continue clinical monitoring - continue vitamin D   JOINT AND NERVE PAIN - start gabapentin 394m at bedtime; may increase to twice a day  - refer to PT evaluation

## 2021-02-13 NOTE — Progress Notes (Signed)
JCV lab specimen placed in [facility] lock box for pick up.

## 2021-02-13 NOTE — Progress Notes (Signed)
Chief Complaint  Patient presents with  . Multiple Sclerosis    Rm 7 "early FU for MS symptoms of pain, medication issues"    History of Present Illness:  UPDATE (02/13/21, VRP): Since last visit, having more injection site reactions from Copaxone which she started in November 2021.  Also having more generalized joint and body pains, more on the right side.  Also having some shooting pains from her lower back down her leg on the right side.   UPDATE (09/11/20 , VRP): Since last visit, doing well. Symptoms are stable. Severity is mild. No alleviating or aggravating factors. Tolerating meds. Lymphocytes in April 2021 were low again (0.2).   UPDATE (12/13/19, VRP): Since last visit, more stress / anxiety (mother in law living with them; work from home; pandemic) --> now more insomnia, right leg numbness, back spasms. Tolerating gilenya.   UPDATE (04/15/19, VRP): Since last visit, doing well. Symptoms a re stable. No new issues. No alleviating or aggravating factors. Tolerating gilenya. S/p cataract procedure. Crohn's is stable; on stellara. Annual physical is pending. Needs follow up labs.    Observations/Objective:  GENERAL EXAM/CONSTITUTIONAL: Vitals:  Vitals:   02/13/21 0916  BP: (!) 156/91  Pulse: 89  Weight: 177 lb 3.2 oz (80.4 kg)  Height: 5' 1.75" (1.568 m)   Body mass index is 32.67 kg/m. Wt Readings from Last 3 Encounters:  02/13/21 177 lb 3.2 oz (80.4 kg)  09/11/20 176 lb 9.6 oz (80.1 kg)  12/13/19 168 lb 3.2 oz (76.3 kg)    Patient is in no distress; well developed, nourished and groomed; neck is supple  CARDIOVASCULAR:  Examination of carotid arteries is normal; no carotid bruits  Regular rate and rhythm, no murmurs  Examination of peripheral vascular system by observation and palpation is normal  EYES:  Ophthalmoscopic exam of optic discs and posterior segments is normal; no papilledema or hemorrhages No exam data present  MUSCULOSKELETAL:  Gait,  strength, tone, movements noted in Neurologic exam below  NEUROLOGIC: MENTAL STATUS:  No flowsheet data found.  awake, alert, oriented to person, place and time  recent and remote memory intact  normal attention and concentration  language fluent, comprehension intact, naming intact  fund of knowledge appropriate  CRANIAL NERVE:   2nd - no papilledema on fundoscopic exam  2nd, 3rd, 4th, 6th - pupils equal and reactive to light, visual fields full to confrontation, extraocular muscles intact, no nystagmus  5th - facial sensation symmetric  7th - facial strength symmetric  8th - hearing intact  9th - palate elevates symmetrically, uvula midline  11th - shoulder shrug symmetric  12th - tongue protrusion midline  MOTOR:   normal bulk and tone, full strength in the BUE, BLE  SENSORY:   normal and symmetric to light touch  COORDINATION:   finger-nose-finger, fine finger movements normal  REFLEXES:   deep tendon reflexes present and symmetric  GAIT/STATION:   narrow based gait  Lab Results  Component Value Date   WBC 4.3 09/11/2020   HGB 13.4 09/11/2020   HCT 38.9 09/11/2020   MCV 87 09/11/2020   PLT 242 09/11/2020    08/06/17 MRI brain (with and without) demonstrating: 1. There are a few small, round and ovoid, periventricular and subcortical chronic demyelinating plaques. 2. No acute plaques. 3. No significant change from MRI on 12/27/15.  09/20/20 MRI brain with and without contrast demonstrating: -Stable periventricular, pericallosal, subcortical and juxtacortical chronic demyelinating plaques. -No acute or new plaques are seen.  Assessment and Plan:  50 y.o. year old female with multiple sclerosis. Paresthesias and swelling of her bilateral feet since Septemeber 2010, which have gradually improved. Diagnosed with multiple sclerosis by MRI brain, t-spine, labs and LP (12 OCBs). Was on rebif 04/27/10 until Feb 2012 (stopped b/c of possible rxns and  also was trying to get pregnant). Now on gilenya and doing well. Also with inflammatory bowel disease (crohn's dz), now on Stelara (ustekinumab; every 2 months pen injection at home).   Tried gilenya but stopped due to low lymphocyte counts (April 2021). Tried copaxone since Nov 2021, but having injection site reactions.     Dx:  1. MS (multiple sclerosis) ([place])   2. Encounter for therapeutic drug monitoring   3. Arthralgia, unspecified joint      MULTIPLE SCLEROSIS - plan to stop copaxone; may consider tysabri if stelara is discontinued; may consider second opinion - continue clinical monitoring - continue vitamin D - continue monitoring: CBC, CMP (per GI or PCP) every 6-12 months  JOINT AND NERVE PAIN - start gabapentin 354m at bedtime; may increase to twice a day  - refer to PT evaluation  Orders Placed This Encounter  Procedures  . Stratify JCV(TM) Ab w/Index  . CBC with Differential/Platelet  . Comprehensive metabolic panel  . Ambulatory referral to Physical Therapy   Meds ordered this encounter  Medications  . gabapentin (NEURONTIN) 300 MG capsule    Sig: Take 1 capsule (300 mg total) by mouth 2 (two) times daily.    Dispense:  60 capsule    Refill:  3    Follow Up Instructions:  - Return in about 6 months (around 08/16/2021).       VPenni Bombard MD 49/59/9774 114:23AM Certified in Neurology, Neurophysiology and Neuroimaging  [facility] [address] [place] [phone]

## 2021-02-14 LAB — COMPREHENSIVE METABOLIC PANEL
ALT: 14 IU/L (ref 0–32)
AST: 10 IU/L (ref 0–40)
Albumin/Globulin Ratio: 2.3 — ABNORMAL HIGH (ref 1.2–2.2)
Albumin: 4.5 g/dL (ref 3.8–4.8)
Alkaline Phosphatase: 66 IU/L (ref 44–121)
BUN/Creatinine Ratio: 14 (ref 9–23)
BUN: 11 mg/dL (ref 6–24)
Bilirubin Total: <0.2 mg/dL (ref 0.0–1.2)
CO2: 21 mmol/L (ref 20–29)
Calcium: 9.9 mg/dL (ref 8.7–10.2)
Chloride: 103 mmol/L (ref 96–106)
Creatinine, Ser: 0.81 mg/dL (ref 0.57–1.00)
Globulin, Total: 2 g/dL (ref 1.5–4.5)
Glucose: 155 mg/dL — ABNORMAL HIGH (ref 65–99)
Potassium: 4 mmol/L (ref 3.5–5.2)
Sodium: 140 mmol/L (ref 134–144)
Total Protein: 6.5 g/dL (ref 6.0–8.5)
eGFR: 89 mL/min/{1.73_m2} (ref >59)

## 2021-02-14 LAB — CBC WITH DIFFERENTIAL/PLATELET
Basophils Absolute: 0 10*3/uL (ref 0.0–0.2)
Basos: 1 %
EOS (ABSOLUTE): 0.2 10*3/uL (ref 0.0–0.4)
Eos: 3 %
Hematocrit: 38.4 % (ref 34.0–46.6)
Hemoglobin: 12.7 g/dL (ref 11.1–15.9)
Immature Grans (Abs): 0 10*3/uL (ref 0.0–0.1)
Immature Granulocytes: 0 %
Lymphocytes Absolute: 0.9 10*3/uL (ref 0.7–3.1)
Lymphs: 17 %
MCH: 28.2 pg (ref 26.6–33.0)
MCHC: 33.1 g/dL (ref 31.5–35.7)
MCV: 85 fL (ref 79–97)
Monocytes Absolute: 0.4 10*3/uL (ref 0.1–0.9)
Monocytes: 7 %
Neutrophils Absolute: 3.6 10*3/uL (ref 1.4–7.0)
Neutrophils: 72 %
Platelets: 281 10*3/uL (ref 150–450)
RBC: 4.51 x10E6/uL (ref 3.77–5.28)
RDW: 12 % (ref 11.7–15.4)
WBC: 5 10*3/uL (ref 3.4–10.8)

## 2021-02-16 ENCOUNTER — Encounter: Payer: Self-pay | Admitting: Physical Therapy

## 2021-02-16 ENCOUNTER — Ambulatory Visit (INDEPENDENT_AMBULATORY_CARE_PROVIDER_SITE_OTHER): Payer: 59 | Admitting: Physical Therapy

## 2021-02-16 ENCOUNTER — Other Ambulatory Visit: Payer: Self-pay

## 2021-02-16 DIAGNOSIS — M545 Low back pain, unspecified: Secondary | ICD-10-CM | POA: Diagnosis not present

## 2021-02-16 DIAGNOSIS — M6281 Muscle weakness (generalized): Secondary | ICD-10-CM

## 2021-02-16 NOTE — Patient Instructions (Signed)
Access Code: [email or web address] URL: [email or web address] Date: 02/16/2021 Prepared by: Daleen Bo  Exercises Supine Posterior Pelvic Tilt - 1 x daily - 7 x weekly - 2 sets - 10 reps - 2 hold Supine Lower Trunk Rotation - 1 x daily - 7 x weekly - 2 sets - 10 reps - 2 hold Supine Figure 4 Piriformis Stretch - 2 x daily - 7 x weekly - 1 sets - 3 reps - 30 hold

## 2021-02-16 NOTE — Therapy (Signed)
[facility] [address] [place], [place], [address] Phone: [phone]   Fax:  [phone]  Physical Therapy Evaluation  Patient Details  Name: Ashley Diaz MRN: [id number] Date of Birth: 23-Jun-1971 Referring Provider (PT): Penni Bombard, MD   Encounter Date: 02/16/2021   PT End of Session - 02/16/21 1243    Visit Number 1    Number of Visits 17    Date for PT Re-Evaluation 04/13/21    Authorization Type UHC    PT Start Time 4492    PT Stop Time 1100    PT Time Calculation (min) 45 min    Activity Tolerance Patient tolerated treatment well;No increased pain    Behavior During Therapy WFL for tasks assessed/performed           Past Medical History:  Diagnosis Date  . Allergy   . Crohn's disease ([place])   . Endometriosis   . Herpes simplex without mention of complication   . Hypertension   . Migraine   . Multiple sclerosis ([place])     Past Surgical History:  Procedure Laterality Date  . CESAREAN SECTION    . CRYOTHERAPY    . FOOT SURGERY    . LAPAROSCOPY    . LASIK Bilateral 2001  . UMBILICAL HERNIA REPAIR      There were no vitals filed for this visit.    Subjective Assessment - 02/16/21 1025    Subjective She has been dealing with a nerve pain issue that comes down her leg for a quite some time. She notices it when walking and it comes down the leg from her perineal region. This tingling has been happening since December. She states is comes from the back into the private area and will shoot down the medial side of her leg. It will last for a few hours at a time. She states it comes down into her feet as well. She states it is "not pain but it is not comfortable." She states it is tingling sensation. The pain has/does occur during sex as well. She notices the strength in her hands has also dimnished and the flesh of her fingers hurt with turning jars. Her problems seem to be more right sided. She states the hand  pain has been going for a year. Pt states she has a hx of LBP esp on the R side.  She denies drop attacks, BB change, or saddle anesthesia. 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 She states she has dizziness on occasion when laying down and turning head to the R.Pt denies cancer red flags.    Limitations Standing    How long can you sit comfortably? hours at work    How long can you stand comfortably? 1-2 hours    How long can you walk comfortably? 1-2 hours    Patient Stated Goals Pt wants the sensation to go a way and return to normal.    Currently in Pain? No/denies    Pain Score 7     Pain Location Hand    Pain Orientation Right    Pain Descriptors / Indicators Sharp    Pain Type Chronic pain    Pain Radiating Towards finger tips    Aggravating Factors  using hand, opening jars    Pain Relieving Factors unknown    Multiple Pain Sites Yes    Pain Score 5    Pain Location Leg    Pain  Orientation Right    Pain Descriptors / Indicators Tingling;Shooting    Pain Type Chronic pain    Pain Radiating Towards from back and perineum down to feet    Pain Onset Yesterday    Aggravating Factors  standing on it, cleaning/bathing    Pain Relieving Factors unknown              [facility] PT Assessment - 02/16/21 0001      Assessment   Medical Diagnosis Lumbar radiculopathy    Referring Provider (PT) Penumalli, Earlean Polka, MD    Hand Dominance Right    Prior Therapy N/A      Precautions   Precautions None      Restrictions   Weight Bearing Restrictions No      Balance Screen   Has the patient fallen in the past 6 months No    Has the patient had a decrease in activity level because of a fear of falling?  No    Is the patient reluctant to leave their home because of a fear of falling?  No      Home Ecologist residence      Prior Function   Level of Independence Independent      Cognition   Overall Cognitive Status Within Functional  Limits for tasks assessed      Sensation   Light Touch Appears Intact      Coordination   Gross Motor Movements are Fluid and Coordinated Yes    Fine Motor Movements are Fluid and Coordinated Yes    Finger Nose Finger Test WNL   (-) dysdiadochokinesia (-) Hoffman's     Functional Tests   Functional tests Sit to Stand      Sit to Stand   Comments WNL      Posture/Postural Control   Posture/Postural Control Postural limitations    Postural Limitations Increased lumbar lordosis      ROM / Strength   AROM / PROM / Strength AROM;PROM;Strength      AROM   Overall AROM Comments L/S: flexion WFL, extension WFL p! on R, R rot and L Rot 50% p! on R with R rot, L & R SB 75%      PROM   Overall PROM Comments Hip: L hip limited in IR, other motions WFL, R lacking ER and IR, stiff into hip flexion 110 deg      Strength   Overall Strength Comments 4/5 hip flexion bilat, 4+/5 ABD and ADD      Flexibility   Soft Tissue Assessment /Muscle Length yes    Hamstrings limited on R    Piriformis sig limited on R with fig 4      Palpation   Spinal mobility L1-4 extension restriction with CPA    SI assessment  no malalignment noted    Palpation comment TTP and hypertonicity of R paraspinals in L/S and R QL      Special Tests    Special Tests Lumbar;Sacrolliac Tests    Lumbar Tests FABER test;Straight Leg Raise    Sacroiliac Tests  Pelvic Compression      FABER test   findings Negative      Straight Leg Raise   Findings Negative      Pelvic Compression   Findings Negative      Ambulation/Gait   Gait Pattern Within Functional Limits  Objective measurements completed on examination: See above findings.       [facility] Adult PT Treatment/Exercise - 02/16/21 0001      Exercises   Exercises Lumbar      Lumbar Exercises: Stretches   Lower Trunk Rotation Limitations 10x 3s hold each    Pelvic Tilt 20 reps    Pelvic Tilt Limitations with QF, relax as  PPT relaxes    Figure 4 Stretch 30 seconds;3 reps      Manual Therapy   Manual Therapy Soft tissue mobilization;Joint mobilization    Joint Mobilization L1-5 CPA grade III    Soft tissue mobilization L/S paraspinals and QL bilat                  PT Education - 02/16/21 1242    Education Details MOI, MS effects on MSK, diagnosis, prognosis, anatomy, HEP, pacing, energy conservation, POC    Person(s) Educated Patient    Methods Explanation;Demonstration;Tactile cues;Verbal cues;Handout    Comprehension Verbalized understanding;Returned demonstration            PT Short Term Goals - 02/16/21 1252      PT SHORT TERM GOAL #1   Title Pt will become independent with HEP in order to demonstrate synthesis of PT education.    Time 2    Period Weeks    Status New      PT SHORT TERM GOAL #2   Title Pt will be able to demonstrate improved hip ROM for deep squatting in order to demonstrate functional improvement in L/S and LE function for ADL, personal, and household activities.    Time 4    Period Weeks             PT Long Term Goals - 02/16/21 1256      PT LONG TERM GOAL #1   Title Pt will be able to demonstrate 5/5 hip MMT in order to demonstrate functional improvement in LE function and strength for return to full ADL.    Time 6    Period Weeks    Status New      PT LONG TERM GOAL #2   Title Pt will report to pain or limitations with standing or walking for >2 hours in order to demonstrate functional improvement in LE function, strength, endurance, and mobility for return to daily activity/demands.                  Plan - 02/16/21 1243    Clinical Impression Statement Pt is a 50 y.o. female presenting to PT eval today with CC of LBP with radiating sx. Pt presents with decreased lumbar ROM, decreased hip strength, and decreased hip ROM. Pt's s/s appear to be consisent with a lumbar radiculopathy with clinical testing. However, given pt's hx of MS, there is the  potential for degenerative neurologic contributions. Also due pt's report of dyspareunia, there may be pelvic floor involvement. PT will continue to assess for need for referral. Clinical testing does not suggest significant UMN or LMN lesions at this time. Pt's impairments and pain limit her from functioning with ADL and personal life. Pt would benefit from continued skilled therapy in order to address pain presentation and maximize functional lumbopelvic strength for prevention of further decline or worsening sx.    Personal Factors and Comorbidities Comorbidity 1;Profession    Examination-Activity Limitations Sleep;Other;Bend;Lift;Carry    Examination-Participation Restrictions Interpersonal Relationship;Occupation;Community Activity    Stability/Clinical Decision Making Evolving/Moderate complexity    Clinical Decision Making Moderate  Rehab Potential Good    PT Frequency 2x / week    PT Duration 8 weeks    PT Treatment/Interventions ADLs/Self Care Home Management;Aquatic Therapy;Biofeedback;Electrical Stimulation;Iontophoresis 97m/ml Dexamethasone;Moist Heat;Traction;Ultrasound;Gait training;Stair training;Functional mobility training;Therapeutic activities;Therapeutic exercise;Neuromuscular re-education;Balance training;Orthotic Fit/Training;Manual techniques;Taping;Energy conservation;Dry needling;Spinal Manipulations;Joint Manipulations;Passive range of motion    PT Next Visit Plan Review HEP, STM gluteals and paraspinals, QL stretch, seated pelvic tilting, butterfly stretch    Recommended Other Services potential pelvic floor referral    Consulted and Agree with Plan of Care Patient           Patient will benefit from skilled therapeutic intervention in order to improve the following deficits and impairments:  Hypomobility,Increased muscle spasms,Decreased range of motion,Pain,Decreased strength,Impaired flexibility  Visit Diagnosis: Pain, lumbar region  Muscle weakness  (generalized)     Problem List Patient Active Problem List   Diagnosis Date Noted  . Multiple sclerosis ([place]   . MS (multiple sclerosis) ([place] 02/28/2012  . Migraines 02/28/2012  . H/O umbilical hernia repair 1315904/03/2012  . HSV (herpes simplex virus) infection 02/28/2012    ADaleen BoPT, DPT 02/16/21 1:08 PM   [id number] [address]Phone: [phone]  Fax:  [phone] Name: Ashley DECELLEMRN: [id number] of Birth: [birth year]

## 2021-02-20 ENCOUNTER — Encounter: Payer: Self-pay | Admitting: *Deleted

## 2021-02-20 ENCOUNTER — Other Ambulatory Visit: Payer: Self-pay

## 2021-02-20 ENCOUNTER — Ambulatory Visit (INDEPENDENT_AMBULATORY_CARE_PROVIDER_SITE_OTHER): Payer: 59 | Admitting: Physical Therapy

## 2021-02-20 ENCOUNTER — Encounter: Payer: Self-pay | Admitting: Physical Therapy

## 2021-02-20 DIAGNOSIS — M545 Low back pain, unspecified: Secondary | ICD-10-CM

## 2021-02-20 DIAGNOSIS — M6281 Muscle weakness (generalized): Secondary | ICD-10-CM

## 2021-02-20 NOTE — Therapy (Addendum)
[facility] [address] [place], [place], [address] Phone: [phone]   Fax:  [phone]  Physical Therapy Treatment  Patient Details  Name: Ashley Diaz MRN: [id number] Date of Birth: 11-09-1971 Referring Provider (PT): Penni Bombard, MD   Encounter Date: 02/20/2021   PT End of Session - 02/20/21 1654    Visit Number 2    Number of Visits 17    Date for PT Re-Evaluation 04/13/21    Authorization Type UHC    PT Start Time 4627    PT Stop Time 0350    PT Time Calculation (min) 44 min    Activity Tolerance Patient tolerated treatment well;No increased pain    Behavior During Therapy WFL for tasks assessed/performed           Past Medical History:  Diagnosis Date  . Allergy   . Crohn's disease ([place])   . Endometriosis   . Herpes simplex without mention of complication   . Hypertension   . Migraine   . Multiple sclerosis ([place])     Past Surgical History:  Procedure Laterality Date  . CESAREAN SECTION    . CRYOTHERAPY    . FOOT SURGERY    . LAPAROSCOPY    . LASIK Bilateral 2001  . UMBILICAL HERNIA REPAIR      There were no vitals filed for this visit.   Subjective Assessment - 02/20/21 1609    Subjective Pt states she has increased muscle spasm currently. She feels it from her back, into her groin, and into the R toes. She did not have trouble/pain during HEP.   Limitations Standing    How long can you sit comfortably? hours at work    How long can you stand comfortably? 1-2 hours    How long can you walk comfortably? 1-2 hours    Patient Stated Goals Pt wants the sensation to go a way and return to normal.    Currently in Pain? Yes    Pain Score 6    Pain Location Leg    Pain Orientation Right    Pain Descriptors / Indicators Tingling    Pain Type Chronic pain    Pain Radiating Towards from groin to toes of R foot    Aggravating Factors  unknown    Pain Relieving Factors unknown    Multiple Pain  Sites No    Pain Onset Yesterday                             [facility] Adult PT Treatment/Exercise - 02/20/21 0001      Ambulation/Gait   Gait Pattern Within Functional Limits      Posture/Postural Control   Posture/Postural Control Postural limitations    Postural Limitations Increased lumbar lordosis      Exercises   Exercises Lumbar      Lumbar Exercises: Stretches   Lower Trunk Rotation Limitations 10x 3s hold each    Pelvic Tilt 20 reps    Pelvic Tilt Limitations with QF, relax as PPT relaxes    Figure 4 Stretch 30 seconds;3 reps    Other Lumbar Stretch Exercise child's pose 10s 3x      Lumbar Exercises: Seated   Other Seated Lumbar Exercises seated pelvic tilts 15x      Lumbar Exercises: Quadruped   Other Quadruped Lumbar Exercises prayer stretch 10s 2x      Manual Therapy   Manual Therapy Soft tissue  mobilization;Joint mobilization    Joint Mobilization L1-5 CPA grade III    Soft tissue mobilization L/S paraspinals and QL bilat                  PT Education - 02/20/21 1654    Education Details anatomy, HEP, pacing, energy conservation, daily positional changes    Person(s) Educated Patient    Methods Explanation;Demonstration;Tactile cues;Verbal cues    Comprehension Verbalized understanding;Returned demonstration            PT Short Term Goals - 02/16/21 1252      PT SHORT TERM GOAL #1   Title Pt will become independent with HEP in order to demonstrate synthesis of PT education.    Time 2    Period Weeks    Status New      PT SHORT TERM GOAL #2   Title Pt will be able to demonstrate improved hip ROM for deep squatting in order to demonstrate functional improvement in L/S and LE function for ADL, personal, and household activities.    Time 4    Period Weeks             PT Long Term Goals - 02/16/21 1256      PT LONG TERM GOAL #1   Title Pt will be able to demonstrate 5/5 hip MMT in order to demonstrate functional  improvement in LE function and strength for return to full ADL.    Time 6    Period Weeks    Status New      PT LONG TERM GOAL #2   Title Pt will report to pain or limitations with standing or walking for >2 hours in order to demonstrate functional improvement in LE function, strength, endurance, and mobility for return to daily activity/demands.                 Plan - 02/20/21 1654    Clinical Impression Statement Pt presented with significantly increased R>L lumbar paraspinal muscle spasm at beginning of today's session that caused tingling from pudendal region down to toes of the R foot. Pt was extremely sensitive to touch and actively spasmed with light pressure. Gentle effleurage and ischemic pressure were able to improve soft tissue extensibility and abolished tingling/radiating sx. Pt found relief with seated pelvic tilts as well. Pt's HEP was not updated due to increase tingling and spasm. Update and progress at next session as tolerated. Pt would benefit from continued skilled therapy in order to address pain presentation and maximize functional lumbopelvic strength for prevention of further decline or worsening sx.    Personal Factors and Comorbidities Comorbidity 1;Profession    Examination-Activity Limitations Sleep;Other;Bend;Lift;Carry    Examination-Participation Restrictions Interpersonal Relationship;Occupation;Community Activity    Stability/Clinical Decision Making Evolving/Moderate complexity    Rehab Potential Good    PT Frequency 2x / week    PT Duration 8 weeks    PT Treatment/Interventions ADLs/Self Care Home Management;Aquatic Therapy;Biofeedback;Electrical Stimulation;Iontophoresis 92m/ml Dexamethasone;Moist Heat;Traction;Ultrasound;Gait training;Stair training;Functional mobility training;Therapeutic activities;Therapeutic exercise;Neuromuscular re-education;Balance training;Orthotic Fit/Training;Manual techniques;Taping;Energy conservation;Dry needling;Spinal  Manipulations;Joint Manipulations;Passive range of motion    PT Next Visit Plan Review HEP, STM gluteals and paraspinals, QL stretch, seated pelvic tilting, butterfly stretch    Consulted and Agree with Plan of Care Patient           Patient will benefit from skilled therapeutic intervention in order to improve the following deficits and impairments:  Hypomobility,Increased muscle spasms,Decreased range of motion,Pain,Decreased strength,Impaired flexibility  Visit Diagnosis: Pain, lumbar region  Muscle weakness (generalized)     Problem List Patient Active Problem List   Diagnosis Date Noted  . Multiple sclerosis ([place])   . MS (multiple sclerosis) ([place]) 02/28/2012  . Migraines 02/28/2012  . H/O umbilical hernia repair 3462 02/28/2012  . HSV (herpes simplex virus) infection 02/28/2012    Daleen Bo PT, DPT 02/20/21 4:58 PM   [facility] [address] [place], [place], [address] Phone: [phone]   Fax:  [phone]  Name: Ashley Diaz MRN: [id number] Date of Birth: 1971-07-30

## 2021-02-22 ENCOUNTER — Encounter: Payer: Self-pay | Admitting: Physical Therapy

## 2021-02-22 ENCOUNTER — Encounter: Payer: Self-pay | Admitting: *Deleted

## 2021-02-22 ENCOUNTER — Ambulatory Visit (INDEPENDENT_AMBULATORY_CARE_PROVIDER_SITE_OTHER): Payer: 59 | Admitting: Physical Therapy

## 2021-02-22 ENCOUNTER — Other Ambulatory Visit: Payer: Self-pay

## 2021-02-22 DIAGNOSIS — M545 Low back pain, unspecified: Secondary | ICD-10-CM | POA: Diagnosis not present

## 2021-02-22 DIAGNOSIS — M6281 Muscle weakness (generalized): Secondary | ICD-10-CM

## 2021-02-22 NOTE — Therapy (Signed)
[facility] [address] [place], [place], [address] Phone: [phone]   Fax:  [phone]  Physical Therapy Treatment  Patient Details  Name: Ashley Diaz MRN: [id number] Date of Birth: [birth year] Referring Provider (PT): Penni Bombard, MD   Encounter Date: 02/22/2021   PT End of Session - 02/22/21 1148    Visit Number 3    Number of Visits 17    Date for PT Re-Evaluation 04/13/21    Authorization Type UHC    PT Start Time 8110    PT Stop Time 1145    PT Time Calculation (min) 40 min    Activity Tolerance Patient tolerated treatment well;No increased pain    Behavior During Therapy WFL for tasks assessed/performed           Past Medical History:  Diagnosis Date  . Allergy   . Crohn's disease ([place])   . Endometriosis   . Herpes simplex without mention of complication   . Hypertension   . Migraine   . Multiple sclerosis ([place])     Past Surgical History:  Procedure Laterality Date  . CESAREAN SECTION    . CRYOTHERAPY    . FOOT SURGERY    . LAPAROSCOPY    . LASIK Bilateral 2001  . UMBILICAL HERNIA REPAIR      There were no vitals filed for this visit.   Subjective Assessment - 02/22/21 1105    Subjective Pt states that she has had spasms around her back since last visit. She states the tingling came back after last session when going grocery shopping. She currently feels spasms but no pain.    Limitations Standing    How long can you sit comfortably? hours at work    How long can you stand comfortably? 1-2 hours    How long can you walk comfortably? 1-2 hours    Patient Stated Goals Pt wants the sensation to go a way and return to normal.    Currently in Pain? No/denies    Pain Score 0-No pain    Pain Orientation Left;Right    Pain Onset --                             [facility] Adult PT Treatment/Exercise - 02/22/21 0001      Ambulation/Gait   Gait Pattern Within Functional Limits       Posture/Postural Control   Posture/Postural Control Postural limitations    Postural Limitations Increased lumbar lordosis      Exercises   Exercises Lumbar      Lumbar Exercises: Stretches   Figure 4 Stretch 30 seconds;3 reps    Other Lumbar Stretch Exercise child's pose 10s 3x      Lumbar Exercises: Standing   Other Standing Lumbar Exercises Paloff press double red 10x each      Lumbar Exercises: Seated   Other Seated Lumbar Exercises seated pelvic tilts 15x      Lumbar Exercises: Supine   Bridge 10 reps      Lumbar Exercises: Quadruped   Madcat/Old Horse 20 reps    Madcat/Old Horse Limitations 2x10    Other Quadruped Lumbar Exercises prayer stretch 10s 2x      Manual Therapy   Manual Therapy Soft tissue mobilization;Joint mobilization    Joint Mobilization L1-5 CPA grade III    Soft tissue mobilization L/S paraspinals and QL bilat  PT Education - 02/22/21 1130    Education Details anatomy, HEP, pacing, energy conservation, daily positional changes, avoiding agg factors    Person(s) Educated Patient    Methods Explanation;Demonstration;Tactile cues;Verbal cues    Comprehension Verbalized understanding;Returned demonstration            PT Short Term Goals - 02/16/21 1252      PT SHORT TERM GOAL #1   Title Pt will become independent with HEP in order to demonstrate synthesis of PT education.    Time 2    Period Weeks    Status New      PT SHORT TERM GOAL #2   Title Pt will be able to demonstrate improved hip ROM for deep squatting in order to demonstrate functional improvement in L/S and LE function for ADL, personal, and household activities.    Time 4    Period Weeks             PT Long Term Goals - 02/16/21 1256      PT LONG TERM GOAL #1   Title Pt will be able to demonstrate 5/5 hip MMT in order to demonstrate functional improvement in LE function and strength for return to full ADL.    Time 6    Period Weeks    Status  New      PT LONG TERM GOAL #2   Title Pt will report to pain or limitations with standing or walking for >2 hours in order to demonstrate functional improvement in LE function, strength, endurance, and mobility for return to daily activity/demands.                 Plan - 02/22/21 1149    Clinical Impression Statement Pt presented with R sided spasm at today's session that was not as sensitive to touch as previous session. Pt had increased soft tissue extensibility R>L after she was able to tolerate STM and mobilization. Pt was able to incoporate more abdominal/core activation exercise but did require increased cuing for neutral spine positioning. Pt has a seated/standing preference for increased lumbar lordosis that may be contributing to increased L/S stiffness. Stretching and repositioning reduces tingling into R LE. Pt would benefit from continued skilled therapy in order to address pain presentation and maximize functional lumbopelvic strength for prevention of further decline or worsening sx.    Personal Factors and Comorbidities Comorbidity 1;Profession    Examination-Activity Limitations Sleep;Other;Bend;Lift;Carry    Examination-Participation Restrictions Interpersonal Relationship;Occupation;Community Activity    Stability/Clinical Decision Making Evolving/Moderate complexity    Rehab Potential Good    PT Frequency 2x / week    PT Duration 8 weeks    PT Treatment/Interventions ADLs/Self Care Home Management;Aquatic Therapy;Biofeedback;Electrical Stimulation;Iontophoresis 19m/ml Dexamethasone;Moist Heat;Traction;Ultrasound;Gait training;Stair training;Functional mobility training;Therapeutic activities;Therapeutic exercise;Neuromuscular re-education;Balance training;Orthotic Fit/Training;Manual techniques;Taping;Energy conservation;Dry needling;Spinal Manipulations;Joint Manipulations;Passive range of motion    PT Next Visit Plan Review HEP, STM gluteals and paraspinals, bridge  review, segmental flexion and extension, T/S rotation    Consulted and Agree with Plan of Care Patient           Patient will benefit from skilled therapeutic intervention in order to improve the following deficits and impairments:  Hypomobility,Increased muscle spasms,Decreased range of motion,Pain,Decreased strength,Impaired flexibility  Visit Diagnosis: Pain, lumbar region  Muscle weakness (generalized)     Problem List Patient Active Problem List   Diagnosis Date Noted  . Multiple sclerosis ([place]   . MS (multiple sclerosis) ([place] 02/28/2012  . Migraines 02/28/2012  . H/O umbilical hernia repair  1999 02/28/2012  . HSV (herpes simplex virus) infection 02/28/2012   Daleen Bo PT, DPT 02/22/21 11:54 AM   [facility] [place], [place], [address] Phone: [phone]   Fax:  [phone]  Name: Ashley Diaz MRN: [id number] Date of Birth: [birth year]

## 2021-02-22 NOTE — Patient Instructions (Signed)
Access Code: [email or web address] URL: [email or web address] Date: 02/22/2021 Prepared by: Daleen Bo  Exercises Supine Lower Trunk Rotation - 1 x daily - 7 x weekly - 2 sets - 10 reps - 2 hold Supine Figure 4 Piriformis Stretch - 2 x daily - 7 x weekly - 1 sets - 3 reps - 30 hold Supine Bridge - 1 x daily - 7 x weekly - 2 sets - 10 reps Cat-Camel - 1 x daily - 7 x weekly - 2 sets - 10 reps - 2 hold

## 2021-02-27 ENCOUNTER — Ambulatory Visit (INDEPENDENT_AMBULATORY_CARE_PROVIDER_SITE_OTHER): Payer: 59 | Admitting: Physical Therapy

## 2021-02-27 ENCOUNTER — Other Ambulatory Visit: Payer: Self-pay

## 2021-02-27 ENCOUNTER — Encounter: Payer: Self-pay | Admitting: *Deleted

## 2021-02-27 DIAGNOSIS — M545 Low back pain, unspecified: Secondary | ICD-10-CM

## 2021-02-27 DIAGNOSIS — M6281 Muscle weakness (generalized): Secondary | ICD-10-CM

## 2021-02-27 NOTE — Therapy (Signed)
[facility] [address] [place], [place], [address] Phone: [phone]   Fax:  [phone]  Physical Therapy Treatment  Patient Details  Name: Ashley Diaz MRN: [id number] Date of Birth: 1970-12-01 Referring Provider (PT): Penni Bombard, MD   Encounter Date: 02/27/2021   PT End of Session - 02/27/21 1250    Visit Number 4    Number of Visits 17    Date for PT Re-Evaluation 04/13/21    Authorization Type UHC    PT Start Time 2353    PT Stop Time 1300    PT Time Calculation (min) 45 min    Activity Tolerance Patient tolerated treatment well;No increased pain    Behavior During Therapy WFL for tasks assessed/performed           Past Medical History:  Diagnosis Date  . Allergy   . Crohn's disease ([place])   . Endometriosis   . Herpes simplex without mention of complication   . Hypertension   . Migraine   . Multiple sclerosis ([place])     Past Surgical History:  Procedure Laterality Date  . CESAREAN SECTION    . CRYOTHERAPY    . FOOT SURGERY    . LAPAROSCOPY    . LASIK Bilateral 2001  . UMBILICAL HERNIA REPAIR      There were no vitals filed for this visit.   Subjective Assessment - 02/27/21 1219    Subjective Pt states that the tingling down the leg is ever so slightly better. She will still have it whent she walks but it will not happen as often.    Limitations Standing    How long can you sit comfortably? hours at work    How long can you stand comfortably? 1-2 hours    How long can you walk comfortably? 1-2 hours    Patient Stated Goals Pt wants the sensation to go a way and return to normal.    Currently in Pain? Yes    Pain Score 3     Pain Location Leg    Pain Orientation Right    Pain Descriptors / Indicators Tingling                             [facility] Adult PT Treatment/Exercise - 02/27/21 0001      Ambulation/Gait   Gait Pattern Within Functional Limits      Posture/Postural  Control   Posture/Postural Control Postural limitations    Postural Limitations Increased lumbar lordosis      Exercises   Exercises Lumbar      Lumbar Exercises: Stretches       Other Lumbar Stretch Exercise child's pose 10s 3x    Other Lumbar Stretch Exercise standing segmental flexion 10x               Lumbar Exercises: Seated   Other Seated Lumbar Exercises seated pelvic tilts 15x    Other Seated Lumbar Exercises seated flexion stretch 10s 10x      Lumbar Exercises: Supine   Bridge 10 reps    Other Supine Lumbar Exercises LE only deadbug      Lumbar Exercises: Sidelying   Other Sidelying Lumbar Exercises open book stretch 10x 3s hold      Lumbar Exercises: Quadruped   Madcat/Old Horse 20 reps    Madcat/Old Horse Limitations 2x10      Manual Therapy   Manual Therapy Soft tissue mobilization;Joint mobilization  Joint Mobilization T8-L1 CPA grade III    Soft tissue mobilization L/S and T/S paraspinals, QL bilat                  PT Education - 02/27/21 1250    Education Details anatomy, HEP, energy conservation, daily positional changes, exercise progression    Person(s) Educated Patient    Methods Explanation;Demonstration;Tactile cues;Verbal cues    Comprehension Verbalized understanding;Returned demonstration            PT Short Term Goals - 02/16/21 1252      PT SHORT TERM GOAL #1   Title Pt will become independent with HEP in order to demonstrate synthesis of PT education.    Time 2    Period Weeks    Status New      PT SHORT TERM GOAL #2   Title Pt will be able to demonstrate improved hip ROM for deep squatting in order to demonstrate functional improvement in L/S and LE function for ADL, personal, and household activities.    Time 4    Period Weeks             PT Long Term Goals - 02/16/21 1256      PT LONG TERM GOAL #1   Title Pt will be able to demonstrate 5/5 hip MMT in order to demonstrate functional improvement in LE function  and strength for return to full ADL.    Time 6    Period Weeks    Status New      PT LONG TERM GOAL #2   Title Pt will report to pain or limitations with standing or walking for >2 hours in order to demonstrate functional improvement in LE function, strength, endurance, and mobility for return to daily activity/demands.                 Plan - 02/27/21 1253    Clinical Impression Statement Pt presented with increased lower T/S spasm today upon palpation. L/S had increased soft tissue extensibility compared to last session. Pt had signficantly decreased spasm after broad deep pressure to bilateral T/S paraspinals. Pt responds well to flexion based exercise and had decreased tingling to 2/10 at end of session. Pt continues to have significant L/S stiffness at this time, as noted by extended position with standing segmental flexion and seated flexion. Pt was able to tolerate LE deadbug without increased tingling. Core strengthening to be within sagittal plane next session to avoid LE sx irritation. Pt would benefit from continued skilled therapy in order to address pain presentation and maximize functional lumbopelvic strength for prevention of further decline or worsening sx.    Personal Factors and Comorbidities Comorbidity 1;Profession    Examination-Activity Limitations Sleep;Other;Bend;Lift;Carry    Examination-Participation Restrictions Interpersonal Relationship;Occupation;Community Activity    Stability/Clinical Decision Making Evolving/Moderate complexity    Rehab Potential Good    PT Frequency 2x / week    PT Duration 8 weeks    PT Treatment/Interventions ADLs/Self Care Home Management;Aquatic Therapy;Biofeedback;Electrical Stimulation;Iontophoresis 16m/ml Dexamethasone;Moist Heat;Traction;Ultrasound;Gait training;Stair training;Functional mobility training;Therapeutic activities;Therapeutic exercise;Neuromuscular re-education;Balance training;Orthotic Fit/Training;Manual  techniques;Taping;Energy conservation;Dry needling;Spinal Manipulations;Joint Manipulations;Passive range of motion    PT Next Visit Plan Review HEP, STM gluteals and paraspinals, bridge, segmental flexion and extension, deadbug    Consulted and Agree with Plan of Care Patient           Patient will benefit from skilled therapeutic intervention in order to improve the following deficits and impairments:  Hypomobility,Increased muscle spasms,Decreased range of motion,Pain,Decreased strength,Impaired  flexibility  Visit Diagnosis: Pain, lumbar region  Muscle weakness (generalized)     Problem List Patient Active Problem List   Diagnosis Date Noted  . Multiple sclerosis ([place])   . MS (multiple sclerosis) ([place]) 02/28/2012  . Migraines 02/28/2012  . H/O umbilical hernia repair 7579 02/28/2012  . HSV (herpes simplex virus) infection 02/28/2012    Daleen Bo PT, DPT 02/27/21 2:15 PM   [facility] [address] [place], [place], [address] Phone: [phone]   Fax:  [phone]  Name: Ashley Diaz MRN: [id number] Date of Birth: Jun 09, 1971

## 2021-02-27 NOTE — Patient Instructions (Signed)
Access Code: [email or web address] URL: [email or web address] Date: 02/27/2021 Prepared by: Daleen Bo  Exercises Supine Lower Trunk Rotation - 1 x daily - 7 x weekly - 2 sets - 10 reps - 2 hold Supine Figure 4 Piriformis Stretch - 2 x daily - 7 x weekly - 1 sets - 3 reps - 30 hold Supine Bridge - 1 x daily - 7 x weekly - 2 sets - 10 reps Cat-Camel - 1 x daily - 7 x weekly - 2 sets - 10 reps - 2 hold Sidelying Thoracic Rotation with Open Book - 1 x daily - 7 x weekly - 2 sets - 10 reps - 3 hold

## 2021-03-01 ENCOUNTER — Ambulatory Visit (INDEPENDENT_AMBULATORY_CARE_PROVIDER_SITE_OTHER): Payer: 59 | Admitting: Physical Therapy

## 2021-03-01 ENCOUNTER — Other Ambulatory Visit: Payer: Self-pay

## 2021-03-01 DIAGNOSIS — M545 Low back pain, unspecified: Secondary | ICD-10-CM | POA: Diagnosis not present

## 2021-03-01 DIAGNOSIS — M6281 Muscle weakness (generalized): Secondary | ICD-10-CM

## 2021-03-02 ENCOUNTER — Encounter: Payer: Self-pay | Admitting: Physical Therapy

## 2021-03-02 NOTE — Therapy (Signed)
[facility] [address] [place], [place], [address] Phone: [phone]   Fax:  [phone]  Physical Therapy Treatment  Patient Details  Name: Ashley Diaz MRN: [id number] Date of Birth: 1971/03/02 Referring Provider (PT): Penni Bombard, MD   Encounter Date: 03/01/2021   PT End of Session - 03/01/21 1654    Visit Number 5    Number of Visits 17    Date for PT Re-Evaluation 04/13/21    Authorization Type UHC    PT Start Time 1600    PT Stop Time 4496    PT Time Calculation (min) 45 min    Activity Tolerance Patient tolerated treatment well;No increased pain    Behavior During Therapy WFL for tasks assessed/performed           Past Medical History:  Diagnosis Date  . Allergy   . Crohn's disease ([place])   . Endometriosis   . Herpes simplex without mention of complication   . Hypertension   . Migraine   . Multiple sclerosis ([place])     Past Surgical History:  Procedure Laterality Date  . CESAREAN SECTION    . CRYOTHERAPY    . FOOT SURGERY    . LAPAROSCOPY    . LASIK Bilateral 2001  . UMBILICAL HERNIA REPAIR      There were no vitals filed for this visit.   Subjective Assessment - 03/01/21 1601    Subjective Pt states that the tingling down the leg is ever so slightly better. She will still have it whent she walks but it will not happen as often.    Limitations Standing    How long can you sit comfortably? hours at work    How long can you stand comfortably? 1-2 hours    How long can you walk comfortably? 1-2 hours    Patient Stated Goals Pt wants the sensation to go a way and return to normal.    Currently in Pain? Yes    Pain Score 2     Pain Location Leg    Pain Orientation Right;Proximal;Posterior    Pain Descriptors / Indicators Tingling    Pain Onset Yesterday                             [facility] Adult PT Treatment/Exercise - 03/02/21 0001      Ambulation/Gait   Gait Pattern Within  Functional Limits      Posture/Postural Control   Posture/Postural Control Postural limitations    Postural Limitations Increased lumbar lordosis      Exercises   Exercises Lumbar      Lumbar Exercises: Stretches   Other Lumbar Stretch Exercise child's pose 10s 3x    Other Lumbar Stretch Exercise standing segmental flexion 10x      Lumbar Exercises: Seated   Other Seated Lumbar Exercises seated pelvic tilts 15x    Other Seated Lumbar Exercises seated flexion stretch 10s 10x      Lumbar Exercises: Supine   Other Supine Lumbar Exercises self massage foam roll T/S      Lumbar Exercises: Sidelying   Other Sidelying Lumbar Exercises open book stretch 10x 3s hold      Lumbar Exercises: Quadruped   Madcat/Old Horse 20 reps    Madcat/Old Horse Limitations 2x10      Manual Therapy   Manual Therapy Soft tissue mobilization;Joint mobilization    Joint Mobilization T8-L1 CPA grade III  Soft tissue mobilization L/S and T/S paraspinals, QL bilat                    PT Short Term Goals - 02/16/21 1252      PT SHORT TERM GOAL #1   Title Pt will become independent with HEP in order to demonstrate synthesis of PT education.    Time 2    Period Weeks    Status New      PT SHORT TERM GOAL #2   Title Pt will be able to demonstrate improved hip ROM for deep squatting in order to demonstrate functional improvement in L/S and LE function for ADL, personal, and household activities.    Time 4    Period Weeks             PT Long Term Goals - 02/16/21 1256      PT LONG TERM GOAL #1   Title Pt will be able to demonstrate 5/5 hip MMT in order to demonstrate functional improvement in LE function and strength for return to full ADL.    Time 6    Period Weeks    Status New      PT LONG TERM GOAL #2   Title Pt will report to pain or limitations with standing or walking for >2 hours in order to demonstrate functional improvement in LE function, strength, endurance, and mobility  for return to daily activity/demands.                 Plan - 03/01/21 1653    Clinical Impression Statement Pt presented with T/S > L/S spasm at beginning of today's session. Pt required deep broad pressure to avoid active muscle spasm during STM and joint mobilization. Pt did have improved joint mobility and ROM following manual therapy. Pt was able to incorporate self massage and foam rolling into HEP in order to promote better self management of symptoms. Pt's NT does appear to be improved based on report of centralizing LE tingling (to mid-thigh vs lower leg). Plan to revisit core stabilization and sagittal plane strengthening at next session. Pt would benefit from continued skilled therapy in order to address pain presentation and maximize functional lumbopelvic strength for prevention of further decline or worsening sx.    Personal Factors and Comorbidities Comorbidity 1;Profession    Examination-Activity Limitations Sleep;Other;Bend;Lift;Carry    Examination-Participation Restrictions Interpersonal Relationship;Occupation;Community Activity    Stability/Clinical Decision Making Evolving/Moderate complexity    Rehab Potential Good    PT Frequency 2x / week    PT Duration 8 weeks    PT Treatment/Interventions ADLs/Self Care Home Management;Aquatic Therapy;Biofeedback;Electrical Stimulation;Iontophoresis 8m/ml Dexamethasone;Moist Heat;Traction;Ultrasound;Gait training;Stair training;Functional mobility training;Therapeutic activities;Therapeutic exercise;Neuromuscular re-education;Balance training;Orthotic Fit/Training;Manual techniques;Taping;Energy conservation;Dry needling;Spinal Manipulations;Joint Manipulations;Passive range of motion    PT Next Visit Plan Review HEP, STM gluteals and paraspinals, LE deadbug, RDL, wall angel    Consulted and Agree with Plan of Care Patient           Patient will benefit from skilled therapeutic intervention in order to improve the following  deficits and impairments:  Hypomobility,Increased muscle spasms,Decreased range of motion,Pain,Decreased strength,Impaired flexibility  Visit Diagnosis: Pain, lumbar region  Muscle weakness (generalized)     Problem List Patient Active Problem List   Diagnosis Date Noted  . Multiple sclerosis ([place]   . MS (multiple sclerosis) ([place] 02/28/2012  . Migraines 02/28/2012  . H/O umbilical hernia repair 1564304/03/2012  . HSV (herpes simplex virus) infection 02/28/2012    ADaleen Bo  PT, DPT 03/02/21 9:45 AM   [facility] [place], [place], [address] Phone: [phone]   Fax:  [phone]  Name: NAKEYSHA PASQUAL MRN: [id number] Date of Birth: Dec 22, 1970

## 2021-03-02 NOTE — Patient Instructions (Signed)
Access Code: [email or web address] URL: [email or web address] Date: 03/02/2021 Prepared by: Daleen Bo  Exercises Supine Lower Trunk Rotation - 1 x daily - 7 x weekly - 2 sets - 10 reps - 2 hold Supine Figure 4 Piriformis Stretch - 2 x daily - 7 x weekly - 1 sets - 3 reps - 30 hold Supine Bridge - 1 x daily - 7 x weekly - 2 sets - 10 reps Cat-Camel - 1 x daily - 7 x weekly - 2 sets - 10 reps - 2 hold Sidelying Thoracic Rotation with Open Book - 1 x daily - 7 x weekly - 2 sets - 10 reps - 3 hold Thoracic Mobilization with Hands Behind Head on Foam Roll - 1 x daily - 7 x weekly - 1 sets - 1 reps - 5 hold

## 2021-03-05 ENCOUNTER — Encounter: Payer: Self-pay | Admitting: Physical Therapy

## 2021-03-05 ENCOUNTER — Ambulatory Visit (INDEPENDENT_AMBULATORY_CARE_PROVIDER_SITE_OTHER): Payer: 59 | Admitting: Physical Therapy

## 2021-03-05 ENCOUNTER — Telehealth: Payer: Self-pay

## 2021-03-05 ENCOUNTER — Other Ambulatory Visit: Payer: Self-pay

## 2021-03-05 DIAGNOSIS — M6281 Muscle weakness (generalized): Secondary | ICD-10-CM | POA: Diagnosis not present

## 2021-03-05 DIAGNOSIS — M545 Low back pain, unspecified: Secondary | ICD-10-CM

## 2021-03-05 NOTE — Patient Instructions (Addendum)
Access Code: [email or web address] URL: [email or web address] Date: 03/05/2021 Prepared by: Daleen Bo  Exercises Supine Figure 4 Piriformis Stretch - 2 x daily - 7 x weekly - 1 sets - 3 reps - 30 hold Cat-Camel - 1 x daily - 7 x weekly - 2 sets - 10 reps - 2 hold Sidelying Thoracic Rotation with Open Book - 1 x daily - 7 x weekly - 2 sets - 10 reps - 3 hold Thoracic Mobilization with Hands Behind Head on Foam Roll - 1 x daily - 7 x weekly - 1 sets - 1 reps - 5 hold Supine Bridge - 1 x daily - 3-4 x weekly - 2 sets - 10 reps DNS Bug Heel Touches - 1 x daily - 3-4 x weekly - 3 sets - 10 reps

## 2021-03-05 NOTE — Telephone Encounter (Signed)
Received Tysabri start form from Dr. Leta Baptist.  Faxed Tysabri start form to [facility].  Received a receipt of confirmation.  Given Tysabri order and start to medical records for time.  Gave Tysabri order and start form, demos, ins, OV notes, imaging, & labs to our Infusion suite for scheduling.

## 2021-03-05 NOTE — Therapy (Signed)
[facility] [address] [place], [place], [address] Phone: [phone]   Fax:  [phone]  Physical Therapy Treatment  Patient Details  Name: Ashley Diaz MRN: [id number] Date of Birth: 1971-03-02 Referring Provider (PT): Penni Bombard, MD   Encounter Date: 03/05/2021   PT End of Session - 03/05/21 1625    Visit Number 6    Number of Visits 17    Date for PT Re-Evaluation 04/13/21    Authorization Type UHC    PT Start Time 1602    PT Stop Time 1642    PT Time Calculation (min) 40 min    Activity Tolerance Patient tolerated treatment well;No increased pain    Behavior During Therapy WFL for tasks assessed/performed           Past Medical History:  Diagnosis Date  . Allergy   . Crohn's disease ([place])   . Endometriosis   . Herpes simplex without mention of complication   . Hypertension   . Migraine   . Multiple sclerosis ([place])     Past Surgical History:  Procedure Laterality Date  . CESAREAN SECTION    . CRYOTHERAPY    . FOOT SURGERY    . LAPAROSCOPY    . LASIK Bilateral 2001  . UMBILICAL HERNIA REPAIR      There were no vitals filed for this visit.   Subjective Assessment - 03/05/21 1607    Subjective Pt states that the tingling is about the same. It is still in the middle of the thigh and did not go past that since last visit. She states it is getting better.    Limitations Standing    How long can you sit comfortably? hours at work    How long can you stand comfortably? 1-2 hours    How long can you walk comfortably? 1-2 hours    Patient Stated Goals Pt wants the sensation to go a way and return to normal.    Currently in Pain? Yes    Pain Score 2     Pain Location Leg    Pain Orientation Right;Proximal;Posterior    Pain Descriptors / Indicators Tingling    Multiple Pain Sites No    Pain Onset Yesterday                             [facility] Adult PT Treatment/Exercise - 03/05/21  0001      Ambulation/Gait   Gait Pattern Within Functional Limits      Posture/Postural Control   Posture/Postural Control Postural limitations    Postural Limitations Increased lumbar lordosis      Exercises   Exercises Lumbar      Lumbar Exercises: Stretches   Other Lumbar Stretch Exercise --    Other Lumbar Stretch Exercise standing segmental flexion 10x      Lumbar Exercises: Standing   Other Standing Lumbar Exercises offset KB march 10lbs 20x; paloff doulbe red 10x each    Other Standing Lumbar Exercises Hip hinge and RDL at wall 20x      Lumbar Exercises: Seated   Other Seated Lumbar Exercises --    Other Seated Lumbar Exercises seated flexion stretch 10s 10x      Lumbar Exercises: Supine   Other Supine Lumbar Exercises self massage foam roll T/S 3 min      Lumbar Exercises: Sidelying   Other Sidelying Lumbar Exercises --      Lumbar Exercises:  Quadruped   Madcat/Old Horse 20 reps    Madcat/Old Horse Limitations 2x10      Manual Therapy   Manual Therapy Soft tissue mobilization;Joint mobilization    Soft tissue mobilization L/S and T/S paraspinals, QL bilat                  PT Education - 03/05/21 1623    Education Details anatomy, HEP, energy conservation, daily positional changes, exercise progression, core strengthening    Person(s) Educated Patient    Methods Explanation;Demonstration;Tactile cues;Verbal cues    Comprehension Verbalized understanding;Returned demonstration;Verbal cues required            PT Short Term Goals - 02/16/21 1252      PT SHORT TERM GOAL #1   Title Pt will become independent with HEP in order to demonstrate synthesis of PT education.    Time 2    Period Weeks    Status New      PT SHORT TERM GOAL #2   Title Pt will be able to demonstrate improved hip ROM for deep squatting in order to demonstrate functional improvement in L/S and LE function for ADL, personal, and household activities.    Time 4    Period Weeks              PT Long Term Goals - 02/16/21 1256      PT LONG TERM GOAL #1   Title Pt will be able to demonstrate 5/5 hip MMT in order to demonstrate functional improvement in LE function and strength for return to full ADL.    Time 6    Period Weeks    Status New      PT LONG TERM GOAL #2   Title Pt will report to pain or limitations with standing or walking for >2 hours in order to demonstrate functional improvement in LE function, strength, endurance, and mobility for return to daily activity/demands.                 Plan - 03/05/21 1651    Clinical Impression Statement Due to improved response, pt's manual therapy was transitioned to self foam rolling and guided self mobilization at beginning of today's session. Pt had abolishment of NT prior to finishing treatment session. Pt was able to progress core stabilization and postural correction exercises at today's session. Pt did find moderate difficulty with hip hinging due to increased L/S compensation with movement. Pt required VC and TC as well as external cuing at the wall in order to improve hip hinge motion. Pt was also able to perform frontal and transverse plane isometric exercises without increasing muscle spasm or creating NT into the L LE. Continue postural correction and core stabilization exercises as tolerated. Pt would benefit from continued skilled therapy in order to address pain presentation and maximize functional lumbopelvic strength for prevention of further decline or worsening sx.    Personal Factors and Comorbidities Comorbidity 1;Profession    Examination-Activity Limitations Sleep;Other;Bend;Lift;Carry    Examination-Participation Restrictions Interpersonal Relationship;Occupation;Community Activity    Stability/Clinical Decision Making Evolving/Moderate complexity    Rehab Potential Good    PT Frequency 2x / week    PT Duration 8 weeks    PT Treatment/Interventions ADLs/Self Care Home Management;Aquatic  Therapy;Biofeedback;Electrical Stimulation;Iontophoresis 27m/ml Dexamethasone;Moist Heat;Traction;Ultrasound;Gait training;Stair training;Functional mobility training;Therapeutic activities;Therapeutic exercise;Neuromuscular re-education;Balance training;Orthotic Fit/Training;Manual techniques;Taping;Energy conservation;Dry needling;Spinal Manipulations;Joint Manipulations;Passive range of motion    PT Next Visit Plan Review review RDL, wall angel, DKTC stretch, offset march with increased weight  Consulted and Agree with Plan of Care Patient           Patient will benefit from skilled therapeutic intervention in order to improve the following deficits and impairments:  Hypomobility,Increased muscle spasms,Decreased range of motion,Pain,Decreased strength,Impaired flexibility  Visit Diagnosis: Pain, lumbar region  Muscle weakness (generalized)     Problem List Patient Active Problem List   Diagnosis Date Noted  . Multiple sclerosis ([place])   . MS (multiple sclerosis) ([place]) 02/28/2012  . Migraines 02/28/2012  . H/O umbilical hernia repair 4827 02/28/2012  . HSV (herpes simplex virus) infection 02/28/2012   Daleen Bo PT, DPT 03/05/21 4:56 PM   [facility] [address] [place], [place], [address] Phone: [phone]   Fax:  [phone]  Name: PASCALE MAVES MRN: [id number] Date of Birth: 02/05/1971

## 2021-03-06 ENCOUNTER — Encounter: Payer: 59 | Admitting: Physical Therapy

## 2021-03-08 ENCOUNTER — Other Ambulatory Visit: Payer: Self-pay

## 2021-03-08 ENCOUNTER — Encounter: Payer: Self-pay | Admitting: Physical Therapy

## 2021-03-08 ENCOUNTER — Ambulatory Visit (INDEPENDENT_AMBULATORY_CARE_PROVIDER_SITE_OTHER): Payer: 59 | Admitting: Physical Therapy

## 2021-03-08 DIAGNOSIS — M545 Low back pain, unspecified: Secondary | ICD-10-CM | POA: Diagnosis not present

## 2021-03-08 DIAGNOSIS — M6281 Muscle weakness (generalized): Secondary | ICD-10-CM

## 2021-03-08 NOTE — Therapy (Signed)
[facility] [address] [place], [place], [address] Phone: [phone]   Fax:  [phone]  Physical Therapy Treatment  Patient Details  Name: Ashley Diaz MRN: [id number] Date of Birth: 09/07/1971 Referring Provider (PT): Penni Bombard, MD   Encounter Date: 03/08/2021   PT End of Session - 03/08/21 1615    Visit Number 7    Number of Visits 17    Date for PT Re-Evaluation 04/13/21    Authorization Type [employer]    PT Start Time 1603    PT Stop Time 9030    PT Time Calculation (min) 42 min    Activity Tolerance Patient tolerated treatment well;No increased pain    Behavior During Therapy WFL for tasks assessed/performed           Past Medical History:  Diagnosis Date  . Allergy   . Crohn's disease ([place])   . Endometriosis   . Herpes simplex without mention of complication   . Hypertension   . Migraine   . Multiple sclerosis ([place])     Past Surgical History:  Procedure Laterality Date  . CESAREAN SECTION    . CRYOTHERAPY    . FOOT SURGERY    . LAPAROSCOPY    . LASIK Bilateral 2001  . UMBILICAL HERNIA REPAIR      There were no vitals filed for this visit.   Subjective Assessment - 03/08/21 1612    Subjective Pt states that the tingling is about the same. She states it is at high thigh level. She states it is definitely gotten better.    Limitations Standing    How long can you sit comfortably? hours at work    How long can you stand comfortably? 1-2 hours    How long can you walk comfortably? 1-2 hours    Patient Stated Goals Pt wants the sensation to go a way and return to normal.    Currently in Pain? No/denies    Pain Score 0-No pain    Pain Onset Yesterday                             [facility] Adult PT Treatment/Exercise - 03/08/21 0001      Ambulation/Gait   Gait Pattern Within Functional Limits      Posture/Postural Control   Posture/Postural Control Postural limitations     Postural Limitations Increased lumbar lordosis      Exercises   Exercises Lumbar      Lumbar Exercises: Stretches   Other Lumbar Stretch Exercise standing segmental flexion 10x      Lumbar Exercises: Standing   Other Standing Lumbar Exercises offset KB fwd step up 10lbs 20x;  paloff doulbe red 10x each    Other Standing Lumbar Exercises RDL at mirror 10lbs 2x10               Lumbar Exercises: Supine   Other Supine Lumbar Exercises self massage foam roll T/S 3 min    Other Supine Lumbar Exercises LE deadbug 20x      Lumbar Exercises: Sidelying   Other Sidelying Lumbar Exercises open book 10x 5s      Lumbar Exercises: Quadruped   Madcat/Old Horse 20 reps    Madcat/Old Horse Limitations 2x10    Other Quadruped Lumbar Exercises kneeling hip extension 2x10    Other Quadruped Lumbar Exercises quad LE extension 10x  PT Education - 03/08/21 1614    Education Details anatomy, HEP, energy conservation, daily positional changes, exercise progression, core strengthening    Person(s) Educated Patient    Methods Explanation;Demonstration;Tactile cues;Verbal cues    Comprehension Verbalized understanding;Returned demonstration            PT Short Term Goals - 02/16/21 1252      PT SHORT TERM GOAL #1   Title Pt will become independent with HEP in order to demonstrate synthesis of PT education.    Time 2    Period Weeks    Status New      PT SHORT TERM GOAL #2   Title Pt will be able to demonstrate improved hip ROM for deep squatting in order to demonstrate functional improvement in L/S and LE function for ADL, personal, and household activities.    Time 4    Period Weeks             PT Long Term Goals - 02/16/21 1256      PT LONG TERM GOAL #1   Title Pt will be able to demonstrate 5/5 hip MMT in order to demonstrate functional improvement in LE function and strength for return to full ADL.    Time 6    Period Weeks    Status New      PT LONG  TERM GOAL #2   Title Pt will report to pain or limitations with standing or walking for >2 hours in order to demonstrate functional improvement in LE function, strength, endurance, and mobility for return to daily activity/demands.                 Plan - 03/08/21 1620    Clinical Impression Statement Pt still continues to demonstrate ability to perform self STM and self mobilization prior to beginning exercise with good improvement in mobility following. Pt was able to progress functional core stability at today's session to include increased external resistance as well as dynamic SL movements. Pt continues to have difficulty with coordinating hip hinging motion without cuing but was able to perform RDL at today's session with mirror cuing. Pt is progressing well with core strengthening and will likely be able to decreased frequency of visits soon. Pt would benefit from continued skilled therapy in order to address pain presentation and maximize functional lumbopelvic strength for prevention of further decline or worsening sx.    Personal Factors and Comorbidities Comorbidity 1;Profession    Examination-Activity Limitations Sleep;Other;Bend;Lift;Carry    Examination-Participation Restrictions Interpersonal Relationship;Occupation;Community Activity    Stability/Clinical Decision Making Evolving/Moderate complexity    Rehab Potential Good    PT Frequency 2x / week    PT Duration 8 weeks    PT Treatment/Interventions ADLs/Self Care Home Management;Aquatic Therapy;Biofeedback;Electrical Stimulation;Iontophoresis 64m/ml Dexamethasone;Moist Heat;Traction;Ultrasound;Gait training;Stair training;Functional mobility training;Therapeutic activities;Therapeutic exercise;Neuromuscular re-education;Balance training;Orthotic Fit/Training;Manual techniques;Taping;Energy conservation;Dry needling;Spinal Manipulations;Joint Manipulations;Passive range of motion    PT Next Visit Plan Review review RDL, wall  angel, bird dog, deadlift, banded pull through    PT Home Exercise Plan [email or web address]    Consulted and Agree with Plan of Care Patient           Patient will benefit from skilled therapeutic intervention in order to improve the following deficits and impairments:  Hypomobility,Increased muscle spasms,Decreased range of motion,Pain,Decreased strength,Impaired flexibility  Visit Diagnosis: Pain, lumbar region  Muscle weakness (generalized)     Problem List Patient Active Problem List   Diagnosis Date Noted  . Multiple sclerosis ([place]   . MS (  multiple sclerosis) ([place]) 02/28/2012  . Migraines 02/28/2012  . H/O umbilical hernia repair 3167 02/28/2012  . HSV (herpes simplex virus) infection 02/28/2012    Daleen Bo PT, DPT 03/08/21 4:48 PM   [facility] [address] [place], [place], [address] Phone: [phone]   Fax:  [phone]  Name: JERALINE MARCINEK MRN: [id number] Date of Birth: 05-Jun-1971

## 2021-03-12 ENCOUNTER — Ambulatory Visit (INDEPENDENT_AMBULATORY_CARE_PROVIDER_SITE_OTHER): Payer: 59 | Admitting: Physical Therapy

## 2021-03-12 ENCOUNTER — Encounter: Payer: Self-pay | Admitting: Physical Therapy

## 2021-03-12 ENCOUNTER — Ambulatory Visit: Payer: 59 | Admitting: Diagnostic Neuroimaging

## 2021-03-12 ENCOUNTER — Other Ambulatory Visit: Payer: Self-pay

## 2021-03-12 DIAGNOSIS — M6281 Muscle weakness (generalized): Secondary | ICD-10-CM

## 2021-03-12 DIAGNOSIS — M545 Low back pain, unspecified: Secondary | ICD-10-CM

## 2021-03-12 NOTE — Telephone Encounter (Signed)
[facility] Shanon Brow) called, did not receive ICD-10 code for Tysabri.   Contact info: [phone] Reference no: [id number]

## 2021-03-12 NOTE — Therapy (Signed)
[facility] [address] [place], [place], [address] Phone: [phone]   Fax:  [phone]  Physical Therapy Treatment  Patient Details  Name: ANELLY SAMARIN MRN: [id number] Date of Birth: August 15, 1971 Referring Provider (PT): Penni Bombard, MD   Encounter Date: 03/12/2021   PT End of Session - 03/12/21 1428    Visit Number 8    Number of Visits 17    Date for PT Re-Evaluation 04/13/21    Authorization Type UHC    PT Start Time 1346    PT Stop Time 1427    PT Time Calculation (min) 41 min    Activity Tolerance Patient tolerated treatment well;No increased pain    Behavior During Therapy WFL for tasks assessed/performed           Past Medical History:  Diagnosis Date  . Allergy   . Crohn's disease ([place])   . Endometriosis   . Herpes simplex without mention of complication   . Hypertension   . Migraine   . Multiple sclerosis ([place])     Past Surgical History:  Procedure Laterality Date  . CESAREAN SECTION    . CRYOTHERAPY    . FOOT SURGERY    . LAPAROSCOPY    . LASIK Bilateral 2001  . UMBILICAL HERNIA REPAIR      There were no vitals filed for this visit.   Subjective Assessment - 03/12/21 1344    Subjective Pt reports that she feels no NT since yesterday. She has felt no sense of paresthesia at all today.    Limitations Standing    How long can you sit comfortably? hours at work    How long can you stand comfortably? 1-2 hours    How long can you walk comfortably? 1-2 hours    Patient Stated Goals Pt wants the sensation to go a way and return to normal.    Currently in Pain? No/denies    Pain Score 0-No pain    Pain Onset Yesterday                             [facility] Adult PT Treatment/Exercise - 03/12/21 0001      Ambulation/Gait   Gait Pattern --      Posture/Postural Control   Posture/Postural Control Postural limitations    Postural Limitations Increased lumbar lordosis       Exercises   Exercises Lumbar      Lumbar Exercises: Stretches   Other Lumbar Stretch Exercise standing segmental flexion 10x      Lumbar Exercises: Aerobic   Recumbent Bike L1 5 min      Lumbar Exercises: Standing   Other Standing Lumbar Exercises offset KB fwd step up 10lbs 20x;  paloff double green10x each          Lumbar Exercises: Supine       Other Supine Lumbar Exercises LE deadbug 10x UE/LE deadbug 2x10     Lumbar Exercises: Sidelying   Other Sidelying Lumbar Exercises open book 10x 5s      Lumbar Exercises: Quadruped   Madcat/Old Horse 20 reps    Madcat/Old Horse Limitations 2x10    Other Quadruped Lumbar Exercises standing hip extension 2x10 blue TB (banded pull through)   Other Quadruped Lumbar Exercises quad LE extension 10x                  PT Education - 03/12/21 1428    Education  Details anatomy, HEP, daily positional changes, exercise progression, core strengthening, pacing    Person(s) Educated Patient    Methods Explanation;Demonstration;Tactile cues;Verbal cues    Comprehension Verbalized understanding;Returned demonstration;Verbal cues required;Tactile cues required            PT Short Term Goals - 02/16/21 1252      PT SHORT TERM GOAL #1   Title Pt will become independent with HEP in order to demonstrate synthesis of PT education.    Time 2    Period Weeks    Status New      PT SHORT TERM GOAL #2   Title Pt will be able to demonstrate improved hip ROM for deep squatting in order to demonstrate functional improvement in L/S and LE function for ADL, personal, and household activities.    Time 4    Period Weeks             PT Long Term Goals - 02/16/21 1256      PT LONG TERM GOAL #1   Title Pt will be able to demonstrate 5/5 hip MMT in order to demonstrate functional improvement in LE function and strength for return to full ADL.    Time 6    Period Weeks    Status New      PT LONG TERM GOAL #2   Title Pt will report to pain  or limitations with standing or walking for >2 hours in order to demonstrate functional improvement in LE function, strength, endurance, and mobility for return to daily activity/demands.                 Plan - 03/12/21 1428    Clinical Impression Statement Pt was able to progress with functional lumbopelvic strengthening at today's session with introduction of increased external loading. Pt was able to increased difficulty of deadbug to include UE but does required VC and TC for neutral spine. With LE hip extension in quad, pt continues to demonstrate lack of motor control and has lateral leaning compensation. Hip hinging was performed with more success with external banded cuing. Pt is progressing well with strengthening and has significantly decreased sx compared to previous session. Plan to decreased frequency at this time. Pt would benefit from continued skilled therapy in order to address pain presentation and maximize functional lumbopelvic strength for prevention of further decline or worsening sx.    Personal Factors and Comorbidities Comorbidity 1;Profession    Examination-Activity Limitations Sleep;Other;Bend;Lift;Carry    Examination-Participation Restrictions Interpersonal Relationship;Occupation;Community Activity    Stability/Clinical Decision Making Evolving/Moderate complexity    Rehab Potential Good    PT Frequency 2x / week    PT Duration 8 weeks    PT Treatment/Interventions ADLs/Self Care Home Management;Aquatic Therapy;Biofeedback;Electrical Stimulation;Iontophoresis 19m/ml Dexamethasone;Moist Heat;Traction;Ultrasound;Gait training;Stair training;Functional mobility training;Therapeutic activities;Therapeutic exercise;Neuromuscular re-education;Balance training;Orthotic Fit/Training;Manual techniques;Taping;Energy conservation;Dry needling;Spinal Manipulations;Joint Manipulations;Passive range of motion    PT Next Visit Plan Review review RDL, wall angel, bird dog, RDL with  increased weight, jefferson curl?    PT Home Exercise Plan [email or web address]   Consulted and Agree with Plan of Care Patient           Patient will benefit from skilled therapeutic intervention in order to improve the following deficits and impairments:  Hypomobility,Increased muscle spasms,Decreased range of motion,Pain,Decreased strength,Impaired flexibility  Visit Diagnosis: Pain, lumbar region  Muscle weakness (generalized)     Problem List Patient Active Problem List   Diagnosis Date Noted  . Multiple sclerosis ([place]   . MS (multiple  sclerosis) ([place]) 02/28/2012  . Migraines 02/28/2012  . H/O umbilical hernia repair 2130 02/28/2012  . HSV (herpes simplex virus) infection 02/28/2012    Daleen Bo PT, DPT 03/12/21 2:37 PM   [facility] [address] [place], [place], [address] Phone: [phone]   Fax:  [phone]  Name: MKAYLA STEELE MRN: [id number] Date of Birth: 1971-05-30

## 2021-03-12 NOTE — Patient Instructions (Signed)
Access Code: [email or web address] URL: [email or web address] Date: 03/12/2021 Prepared by: Daleen Bo  Exercises Supine Figure 4 Piriformis Stretch - 2 x daily - 7 x weekly - 1 sets - 3 reps - 30 hold Cat-Camel - 1 x daily - 7 x weekly - 2 sets - 10 reps - 2 hold Sidelying Thoracic Rotation with Open Book - 1 x daily - 7 x weekly - 2 sets - 10 reps - 3 hold Thoracic Mobilization with Hands Behind Head on Foam Roll - 1 x daily - 7 x weekly - 1 sets - 1 reps - 5 hold Supine Bridge - 1 x daily - 3-4 x weekly - 2 sets - 10 reps Tall Kneeling Hip Hinge - 1 x daily - 3-4 x weekly - 3 sets - 10 reps Supine Dead Bug with Leg Extension - 1 x daily - 3-4 x weekly - 3 sets - 10 reps

## 2021-03-12 NOTE — Telephone Encounter (Signed)
I called [facility].  Spoke with Erlene Quan.  I provided the ICD 10 code of [place].

## 2021-03-14 ENCOUNTER — Encounter: Payer: 59 | Admitting: Physical Therapy

## 2021-03-19 NOTE — Telephone Encounter (Signed)
Received on 03/15/21 from [employer]: we ave completed benefits verification for Rx Tysabri. Contacting patient to review coverage, benefits. If unable to contact patient in 3 days, a fax will be sent to your office.

## 2021-03-19 NOTE — Telephone Encounter (Signed)
Noted  

## 2021-03-21 ENCOUNTER — Ambulatory Visit (INDEPENDENT_AMBULATORY_CARE_PROVIDER_SITE_OTHER): Payer: 59 | Admitting: Physical Therapy

## 2021-03-21 ENCOUNTER — Other Ambulatory Visit: Payer: Self-pay

## 2021-03-21 ENCOUNTER — Encounter: Payer: Self-pay | Admitting: Physical Therapy

## 2021-03-21 DIAGNOSIS — M6281 Muscle weakness (generalized): Secondary | ICD-10-CM | POA: Diagnosis not present

## 2021-03-21 DIAGNOSIS — M545 Low back pain, unspecified: Secondary | ICD-10-CM | POA: Diagnosis not present

## 2021-03-21 NOTE — Patient Instructions (Addendum)
Access Code: [email or web address] URL: [email or web address] Date: 03/21/2021 Prepared by: Daleen Bo  Exercises Supine Figure 4 Piriformis Stretch - 2 x daily - 7 x weekly - 1 sets - 3 reps - 30 hold Cat-Camel - 1 x daily - 7 x weekly - 2 sets - 10 reps - 2 hold Sidelying Thoracic Rotation with Open Book - 1 x daily - 7 x weekly - 2 sets - 10 reps - 3 hold Thoracic Mobilization with Hands Behind Head on Foam Roll - 1 x daily - 7 x weekly - 1 sets - 1 reps - 5 hold Supine Bridge - 1 x daily - 3-4 x weekly - 2 sets - 10 reps Tall Kneeling Hip Hinge - 1 x daily - 3-4 x weekly - 3 sets - 10 reps Supine Dead Bug with Leg Extension - 1 x daily - 3-4 x weekly - 3 sets - 10 reps Swiss Ball March - 1 x daily - 3-4 x weekly - 2 sets - 20 reps Bird Dog - 1 x daily - 3-4 x weekly - 2 sets - 10 reps

## 2021-03-21 NOTE — Therapy (Signed)
[facility] [address] [place], [place], [address] Phone: [phone]   Fax:  [phone]  Physical Therapy Treatment  Patient Details  Name: Ashley Diaz MRN: [id number] Date of Birth: [birth year] Referring Provider (PT): Penni Bombard, MD   Encounter Date: 03/21/2021   PT End of Session - 03/21/21 1551    Visit Number 9    Number of Visits 17    Date for PT Re-Evaluation 04/13/21    Authorization Type UHC    PT Start Time 6979    PT Stop Time 1640    PT Time Calculation (min) 45 min    Activity Tolerance Patient tolerated treatment well;No increased pain    Behavior During Therapy WFL for tasks assessed/performed           Past Medical History:  Diagnosis Date  . Allergy   . Crohn's disease ([place])   . Endometriosis   . Herpes simplex without mention of complication   . Hypertension   . Migraine   . Multiple sclerosis ([place])     Past Surgical History:  Procedure Laterality Date  . CESAREAN SECTION    . CRYOTHERAPY    . FOOT SURGERY    . LAPAROSCOPY    . LASIK Bilateral 2001  . UMBILICAL HERNIA REPAIR      There were no vitals filed for this visit.   Subjective Assessment - 03/21/21 1553    Subjective Pt reports that she feels no NT since last session. She had some fatigue one day into her back but no paresthesia.    Limitations Standing    How long can you sit comfortably? --    How long can you stand comfortably? 1-2 hours    How long can you walk comfortably? 1-2 hours    Patient Stated Goals Pt wants the sensation to go a way and return to normal.    Currently in Pain? No/denies    Pain Score 0-No pain    Pain Onset Yesterday                             [facility] Adult PT Treatment/Exercise - 03/21/21 0001      Posture/Postural Control   Posture/Postural Control Postural limitations    Postural Limitations Increased lumbar lordosis      Exercises   Exercises Lumbar       Lumbar Exercises: Stretches   Other Lumbar Stretch Exercise Jefferson curl 10x 5lbs      Lumbar Exercises: Aerobic   Recumbent Bike L1 6 min      Lumbar Exercises: Standing   Other Standing Lumbar Exercises paloff press double blue 15x each    Other Standing Lumbar Exercises Romanian Deadlift goblet squat 2x10 10lbs  2x10 10lbs      Lumbar Exercises: Seated   Other Seated Lumbar Exercises swissball march 2x10      Lumbar Exercises: Supine       Other Supine Lumbar Exercises UE/LE deadbug 20x               Lumbar Exercises: Quadruped   Madcat/Old Horse Limitations 10x    Other Quadruped Lumbar Exercises standing hip extension 2x10 black TB   banded pull through   Other Quadruped Lumbar Exercises bird dog 2x10                  PT Education - 03/21/21 1606    Education Details anatomy, HEP, daily  positional changes, exercise progression, core strengthening    Person(s) Educated Patient    Methods Explanation;Demonstration;Tactile cues;Verbal cues    Comprehension Verbal cues required;Returned demonstration;Verbalized understanding;Tactile cues required            PT Short Term Goals - 02/16/21 1252      PT SHORT TERM GOAL #1   Title Pt will become independent with HEP in order to demonstrate synthesis of PT education.    Time 2    Period Weeks    Status New      PT SHORT TERM GOAL #2   Title Pt will be able to demonstrate improved hip ROM for deep squatting in order to demonstrate functional improvement in L/S and LE function for ADL, personal, and household activities.    Time 4    Period Weeks             PT Long Term Goals - 02/16/21 1256      PT LONG TERM GOAL #1   Title Pt will be able to demonstrate 5/5 hip MMT in order to demonstrate functional improvement in LE function and strength for return to full ADL.    Time 6    Period Weeks    Status New      PT LONG TERM GOAL #2   Title Pt will report to pain or limitations with standing or  walking for >2 hours in order to demonstrate functional improvement in LE function, strength, endurance, and mobility for return to daily activity/demands.                 Plan - 03/21/21 1654    Clinical Impression Statement Pt continues to demonstrate improvement with lumbopelvic strength and stability as demonstrated by increased resistance with exercise and improved quality of motion of stability based and lifting exercises. Pt still has some increased difficulty with coordination of compound hip hinging movements but does improve with verbal and tactile cuing. Pt's HEP was updated to include core stability exercises with increased degrees of freedom. Plan to D/C at next visit or soon thereafter if pt continues to demonstrate no paresthesias. Pt would benefit from continued skilled therapy in order to address pain presentation and maximize functional lumbopelvic strength for prevention of further decline or worsening sx.    Personal Factors and Comorbidities Comorbidity 1;Profession    Examination-Activity Limitations Sleep;Other;Bend;Lift;Carry    Examination-Participation Restrictions Interpersonal Relationship;Occupation;Community Activity    Stability/Clinical Decision Making Evolving/Moderate complexity    Rehab Potential Good    PT Frequency 2x / week    PT Duration 8 weeks    PT Treatment/Interventions ADLs/Self Care Home Management;Aquatic Therapy;Biofeedback;Electrical Stimulation;Iontophoresis 92m/ml Dexamethasone;Moist Heat;Traction;Ultrasound;Gait training;Stair training;Functional mobility training;Therapeutic activities;Therapeutic exercise;Neuromuscular re-education;Balance training;Orthotic Fit/Training;Manual techniques;Taping;Energy conservation;Dry needling;Spinal Manipulations;Joint Manipulations;Passive range of motion    PT Next Visit Plan Review review RDL, wall angel, jefferson curl review, RDL review,    PT Home Exercise Plan [email or web address]    Consulted and Agree with  Plan of Care Patient           Patient will benefit from skilled therapeutic intervention in order to improve the following deficits and impairments:  Hypomobility,Increased muscle spasms,Decreased range of motion,Pain,Decreased strength,Impaired flexibility  Visit Diagnosis: Pain, lumbar region  Muscle weakness (generalized)     Problem List Patient Active Problem List   Diagnosis Date Noted  . Multiple sclerosis ([place]   . MS (multiple sclerosis) ([place] 02/28/2012  . Migraines 02/28/2012  . H/O umbilical hernia repair 1099804/03/2012  . HSV (herpes  simplex virus) infection 02/28/2012    Daleen Bo 03/21/2021, 4:57 PM  [facility] [address] [place], [place], [address] Phone: [phone]   Fax:  [phone]  Name: Ashley Diaz MRN: [id number] Date of Birth: 08-15-1971

## 2021-03-21 NOTE — Telephone Encounter (Signed)
Received fax from [facility] : patient is enrolled in [employer]. The balance of each month's co pay for Tysabri  only will be covered the Program, up to $20,000 per calendar year of assistance. Copy of fax given to [facility], infusion suite.

## 2021-03-26 NOTE — Telephone Encounter (Signed)
Patient had her first Tysabri infusion on March 22, 2021.

## 2021-04-11 ENCOUNTER — Ambulatory Visit (INDEPENDENT_AMBULATORY_CARE_PROVIDER_SITE_OTHER): Payer: 59 | Admitting: Physical Therapy

## 2021-04-11 ENCOUNTER — Other Ambulatory Visit: Payer: Self-pay

## 2021-04-11 ENCOUNTER — Encounter: Payer: Self-pay | Admitting: Physical Therapy

## 2021-04-11 DIAGNOSIS — M545 Low back pain, unspecified: Secondary | ICD-10-CM

## 2021-04-11 DIAGNOSIS — M6281 Muscle weakness (generalized): Secondary | ICD-10-CM

## 2021-04-11 NOTE — Patient Instructions (Signed)
Access Code: [email or web address] URL: [email or web address] Date: 04/11/2021 Prepared by: Daleen Bo  Exercises Supine Figure 4 Piriformis Stretch - 2 x daily - 7 x weekly - 1 sets - 3 reps - 30 hold Cat-Camel - 1 x daily - 7 x weekly - 2 sets - 10 reps - 2 hold Sidelying Thoracic Rotation with Open Book - 1 x daily - 7 x weekly - 2 sets - 10 reps - 3 hold Thoracic Mobilization with Hands Behind Head on Foam Roll - 1 x daily - 7 x weekly - 1 sets - 1 reps - 5 hold Supine Bridge - 1 x daily - 3-4 x weekly - 2 sets - 10 reps Tall Kneeling Hip Hinge - 1 x daily - 3-4 x weekly - 3 sets - 10 reps Supine Dead Bug with Leg Extension - 1 x daily - 3-4 x weekly - 3 sets - 10 reps Swiss Ball March - 1 x daily - 3-4 x weekly - 2 sets - 20 reps Bird Dog - 1 x daily - 3-4 x weekly - 2 sets - 10 reps

## 2021-04-11 NOTE — Therapy (Signed)
[facility] [address] [place], [place], [address] Phone: [phone]   Fax:  [phone]  Physical Therapy Treatment  Patient Details  Name: Ashley Diaz MRN: [id number] Date of Birth: 06/24/1971 Referring Provider (PT): Penni Bombard, MD   Encounter Date: 04/11/2021   PT End of Session - 04/11/21 1628    Visit Number 10    Number of Visits 17    Date for PT Re-Evaluation 04/13/21    Authorization Type UHC    PT Start Time 4536    PT Stop Time 1622    PT Time Calculation (min) 15 min    Activity Tolerance Patient tolerated treatment well;No increased pain    Behavior During Therapy WFL for tasks assessed/performed           Past Medical History:  Diagnosis Date  . Allergy   . Crohn's disease ([place])   . Endometriosis   . Herpes simplex without mention of complication   . Hypertension   . Migraine   . Multiple sclerosis ([place])     Past Surgical History:  Procedure Laterality Date  . CESAREAN SECTION    . CRYOTHERAPY    . FOOT SURGERY    . LAPAROSCOPY    . LASIK Bilateral 2001  . UMBILICAL HERNIA REPAIR      There were no vitals filed for this visit.   Subjective Assessment - 04/11/21 1633    Subjective Pt states she is totally back to normal. She has had no signs of tingling at all since last session. She has been doing her HEP every other day at this point.    Limitations Standing    How long can you stand comfortably? 1-2 hours    How long can you walk comfortably? 1-2 hours    Patient Stated Goals Pt wants the sensation to go a way and return to normal.    Currently in Pain? No/denies    Pain Score 0-No pain    Pain Onset Wilburn Mylar              [facility] PT Assessment - 04/11/21 0001      Assessment   Medical Diagnosis Lumbar radiculopathy    Referring Provider (PT) Penumalli, Earlean Polka, MD    Hand Dominance Right    Prior Therapy N/A      Precautions   Precautions None      Restrictions    Weight Bearing Restrictions No      Balance Screen   Has the patient fallen in the past 6 months No      [place] residence      Prior Function   Level of Independence Independent      Cognition   Overall Cognitive Status Within Functional Limits for tasks assessed      Observation/Other Assessments   Other Surveys  Oswestry Disability Index    Oswestry Disability Index  0% Disability      Sensation   Light Touch Appears Intact      Coordination   Gross Motor Movements are Fluid and Coordinated Yes    Fine Motor Movements are Fluid and Coordinated Yes    Finger Nose Finger Test WNL      Functional Tests   Functional tests Sit to Stand      Sit to Stand   Comments WNL      Posture/Postural Control   Posture/Postural Control Postural limitations    Postural Limitations Increased  lumbar lordosis      AROM   Overall AROM Comments L/S ROM WFL, no recreation of sx      Strength   Overall Strength Comments 4+/5 throughout bilaterl hips      Palpation   SI assessment  no malalignment noted      Special Tests    Special Tests Lumbar;Sacrolliac Tests    Lumbar Tests FABER test;Straight Leg Raise    Sacroiliac Tests  Pelvic Compression      FABER test   findings Negative      Straight Leg Raise   Findings Negative      Pelvic Compression   Findings Negative      Ambulation/Gait   Gait Pattern Within Functional Limits               HEP printed out and reviewed. Increased resistance Theraband provided and instruction given for increased difficulty of exercise.                   PT Education - 04/11/21 1620    Education Details HEP, self exercise progression, core strengthening, walking program, graded progression of exercise, D/C plan    Person(s) Educated Patient    Methods Explanation;Demonstration;Tactile cues;Verbal cues;Handout    Comprehension Verbalized understanding;Returned  demonstration;Tactile cues required;Verbal cues required            PT Short Term Goals - 04/11/21 1630      PT SHORT TERM GOAL #1   Title Pt will become independent with HEP in order to demonstrate synthesis of PT education.    Time 2    Period Weeks    Status Achieved      PT SHORT TERM GOAL #2   Title Pt will be able to demonstrate improved hip ROM for deep squatting in order to demonstrate functional improvement in L/S and LE function for ADL, personal, and household activities.    Time 4    Period Weeks    Status Achieved             PT Long Term Goals - 04/11/21 1631      PT LONG TERM GOAL #1   Title Pt will be able to demonstrate 5/5 hip MMT in order to demonstrate functional improvement in LE function and strength for return to full ADL.    Time 6    Period Weeks    Status Achieved      PT LONG TERM GOAL #2   Title Pt will report to pain or limitations with standing or walking for >2 hours in order to demonstrate functional improvement in LE function, strength, endurance, and mobility for return to daily activity/demands.    Status Achieved                 Plan - 04/11/21 1629    Clinical Impression Statement Pt demonstrates significant improvement since baseline evaluation. Pt has not had irritation or sx into LE in over 3 weeks at this time. Pt HEP was reviewed and pt given edu about self progression of exercse as well as tapering of HEP or performance PRN as needed to maintain current state. Pt has good understanding of D/C and has met all therapy goals. D/C this episode of care.    Personal Factors and Comorbidities Comorbidity 1;Profession    Examination-Activity Limitations Sleep;Other;Bend;Lift;Carry    Examination-Participation Restrictions Interpersonal Relationship;Occupation;Community Activity    Stability/Clinical Decision Making Evolving/Moderate complexity    Rehab Potential Good    PT Frequency  2x / week    PT Duration 8 weeks    PT  Treatment/Interventions ADLs/Self Care Home Management;Aquatic Therapy;Biofeedback;Electrical Stimulation;Iontophoresis 90m/ml Dexamethasone;Moist Heat;Traction;Ultrasound;Gait training;Stair training;Functional mobility training;Therapeutic activities;Therapeutic exercise;Neuromuscular re-education;Balance training;Orthotic Fit/Training;Manual techniques;Taping;Energy conservation;Dry needling;Spinal Manipulations;Joint Manipulations;Passive range of motion    PT Next Visit Plan Review review RDL, wall angel, jefferson curl review, RDL review,    PT Home Exercise Plan [id number]    Consulted and Agree with Plan of Care Patient           Patient will benefit from skilled therapeutic intervention in order to improve the following deficits and impairments:  Hypomobility,Increased muscle spasms,Decreased range of motion,Pain,Decreased strength,Impaired flexibility  Visit Diagnosis: Pain, lumbar region  Muscle weakness (generalized)     Problem List Patient Active Problem List   Diagnosis Date Noted  . Multiple sclerosis ([place]   . MS (multiple sclerosis) ([place] 02/28/2012  . Migraines 02/28/2012  . H/O umbilical hernia repair 1634904/03/2012  . HSV (herpes simplex virus) infection 02/28/2012    ADaleen BoPT, DPT 04/11/21 4:36 PM   [address] [place] [address]Phone: [phone]  Fax:  [phone] Name: DCALYNN FERREROMRN: [id number] of Birth: [birth year]  PHYSICAL THERAPY DISCHARGE SUMMARY  Visits from Start of Care: 10    Plan: Patient agrees to discharge.  Patient goals were met. Patient is being discharged due to meeting the stated rehab goals.  ?????

## 2021-04-16 NOTE — Telephone Encounter (Signed)
[facility] Merrilee [place]) called, needing to verify ICD-10 code for Tysabri.. Nurse can call if needed if ICD-10 Code has changed.   Informed him ICD-10 Code of G35

## 2021-04-16 NOTE — Telephone Encounter (Signed)
Noted, thanks. ICD-10 for tysabri infusion is G35.

## 2021-08-09 ENCOUNTER — Telehealth: Payer: Self-pay | Admitting: Neurology

## 2021-08-09 ENCOUNTER — Other Ambulatory Visit: Payer: 59

## 2021-08-09 ENCOUNTER — Encounter: Payer: Self-pay | Admitting: Diagnostic Neuroimaging

## 2021-08-09 ENCOUNTER — Other Ambulatory Visit: Payer: Self-pay | Admitting: Neurology

## 2021-08-09 DIAGNOSIS — G35 Multiple sclerosis: Secondary | ICD-10-CM

## 2021-08-09 DIAGNOSIS — Z5181 Encounter for therapeutic drug level monitoring: Secondary | ICD-10-CM

## 2021-08-09 NOTE — Telephone Encounter (Signed)
Placed JCV lab in [facility] lock box for routine lab pick up. Results pending. 

## 2021-08-10 LAB — CBC WITH DIFFERENTIAL/PLATELET
Basophils Absolute: 0.1 10*3/uL (ref 0.0–0.2)
Basos: 1 %
EOS (ABSOLUTE): 0.3 10*3/uL (ref 0.0–0.4)
Eos: 3 %
Hematocrit: 36.4 % (ref 34.0–46.6)
Hemoglobin: 12 g/dL (ref 11.1–15.9)
Immature Grans (Abs): 0 10*3/uL (ref 0.0–0.1)
Immature Granulocytes: 0 %
Lymphocytes Absolute: 2.8 10*3/uL (ref 0.7–3.1)
Lymphs: 32 %
MCH: 27.9 pg (ref 26.6–33.0)
MCHC: 33 g/dL (ref 31.5–35.7)
MCV: 85 fL (ref 79–97)
Monocytes Absolute: 0.6 10*3/uL (ref 0.1–0.9)
Monocytes: 6 %
NRBC: 2 % — ABNORMAL HIGH (ref 0–0)
Neutrophils Absolute: 5.1 10*3/uL (ref 1.4–7.0)
Neutrophils: 58 %
Platelets: 291 10*3/uL (ref 150–450)
RBC: 4.3 x10E6/uL (ref 3.77–5.28)
RDW: 13.5 % (ref 11.7–15.4)
WBC: 8.8 10*3/uL (ref 3.4–10.8)

## 2021-08-20 ENCOUNTER — Ambulatory Visit (INDEPENDENT_AMBULATORY_CARE_PROVIDER_SITE_OTHER): Payer: 59 | Admitting: Diagnostic Neuroimaging

## 2021-08-20 ENCOUNTER — Encounter: Payer: Self-pay | Admitting: Diagnostic Neuroimaging

## 2021-08-20 ENCOUNTER — Telehealth: Payer: Self-pay | Admitting: *Deleted

## 2021-08-20 VITALS — BP 158/91 | HR 81 | Ht 61.0 in | Wt 180.0 lb

## 2021-08-20 DIAGNOSIS — K50119 Crohn's disease of large intestine with unspecified complications: Secondary | ICD-10-CM

## 2021-08-20 DIAGNOSIS — Z5181 Encounter for therapeutic drug level monitoring: Secondary | ICD-10-CM | POA: Diagnosis not present

## 2021-08-20 DIAGNOSIS — G35 Multiple sclerosis: Secondary | ICD-10-CM | POA: Diagnosis not present

## 2021-08-20 NOTE — Progress Notes (Signed)
Chief Complaint  Patient presents with   Follow-up    Rm 7 alone Pt is well and stable, MS doing well on Tysabri    History of Present Illness:  UPDATE (08/20/21, VRP): Since last visit, now on tysabri since Mar 22, 2021  doing well. MS and GI sxs stable.   UPDATE (02/13/21, VRP): Since last visit, having more injection site reactions from Copaxone which she started in November 2021.  Also having more generalized joint and body pains, more on the right side.  Also having some shooting pains from her lower back down her leg on the right side.   UPDATE (09/11/20 , VRP): Since last visit, doing well. Symptoms are stable. Severity is mild. No alleviating or aggravating factors. Tolerating meds. Lymphocytes in April 2021 were low again (0.2).   UPDATE (12/13/19, VRP): Since last visit, more stress / anxiety (mother in law living with them; work from home; pandemic) --> now more insomnia, right leg numbness, back spasms. Tolerating gilenya.   UPDATE (04/15/19, VRP): Since last visit, doing well. Symptoms a re stable. No new issues. No alleviating or aggravating factors. Tolerating gilenya. S/p cataract procedure. Crohn's is stable; on stellara. Annual physical is pending. Needs follow up labs.    Observations/Objective:  GENERAL EXAM/CONSTITUTIONAL: Vitals:  Vitals:   08/20/21 1553  BP: (!) 158/91  Pulse: 81  Weight: 180 lb (81.6 kg)  Height: 5' 1"  (1.549 m)   Body mass index is 34.01 kg/m. Wt Readings from Last 3 Encounters:  08/20/21 180 lb (81.6 kg)  02/13/21 177 lb 3.2 oz (80.4 kg)  09/11/20 176 lb 9.6 oz (80.1 kg)   Patient is in no distress; well developed, nourished and groomed; neck is supple  CARDIOVASCULAR: Examination of carotid arteries is normal; no carotid bruits Regular rate and rhythm, no murmurs Examination of peripheral vascular system by observation and palpation is normal  EYES: Ophthalmoscopic exam of optic discs and posterior segments is normal; no  papilledema or hemorrhages No results found.  MUSCULOSKELETAL: Gait, strength, tone, movements noted in Neurologic exam below  NEUROLOGIC: MENTAL STATUS:  No flowsheet data found. awake, alert, oriented to person, place and time recent and remote memory intact normal attention and concentration language fluent, comprehension intact, naming intact fund of knowledge appropriate  CRANIAL NERVE:  2nd - no papilledema on fundoscopic exam 2nd, 3rd, 4th, 6th - pupils equal and reactive to light, visual fields full to confrontation, extraocular muscles intact, no nystagmus 5th - facial sensation symmetric 7th - facial strength symmetric 8th - hearing intact 9th - palate elevates symmetrically, uvula midline 11th - shoulder shrug symmetric 12th - tongue protrusion midline  MOTOR:  normal bulk and tone, full strength in the BUE, BLE  SENSORY:  normal and symmetric to light touch  COORDINATION:  finger-nose-finger, fine finger movements normal  REFLEXES:  deep tendon reflexes present and symmetric  GAIT/STATION:  narrow based gait  Lab Results  Component Value Date   WBC 8.8 08/09/2021   HGB 12.0 08/09/2021   HCT 36.4 08/09/2021   MCV 85 08/09/2021   PLT 291 08/09/2021    08/06/17 MRI brain (with and without) demonstrating: 1. There are a few small, round and ovoid, periventricular and subcortical chronic demyelinating plaques. 2. No acute plaques. 3. No significant change from MRI on 12/27/15.  09/20/20 MRI brain with and without contrast demonstrating: -Stable periventricular, pericallosal, subcortical and juxtacortical chronic demyelinating plaques. -No acute or new plaques are seen.   Assessment and Plan:  50 y.o. year old female with multiple sclerosis. Paresthesias and swelling of her bilateral feet since Septemeber 2010, which have gradually improved. Diagnosed with multiple sclerosis by MRI brain, t-spine, labs and LP (12 OCBs). Was on rebif 04/27/10 until Feb  2012 (stopped b/c of possible rxns and also was trying to get pregnant). Now on gilenya and doing well. Also with inflammatory bowel disease (crohn's dz), now on Stelara (ustekinumab; every 2 months pen injection at home).   Tried gilenya but stopped due to low lymphocyte counts (April 2021). Tried copaxone since Nov 2021, but having injection site reactions.     Dx:  1. MS (multiple sclerosis) ([place])   2. Crohn's disease of large intestine with complication ([place])   3. Encounter for therapeutic drug monitoring      MULTIPLE SCLEROSIS - continue tysabri - continue clinical monitoring - continue vitamin D - continue monitoring: CBC, CMP, JCV ab every 6; MRI annually (next April 2023)  JOINT AND NERVE PAIN (resolved) - continue PT exercises   Follow Up Instructions:  - Return in about 6 months (around 02/17/2022) for MyChart visit (35mn).       VPenni Bombard MD 94/65/6812 47:51PM Certified in Neurology, Neurophysiology and Neuroimaging  [facility] [address] [place] [phone]

## 2021-08-20 NOTE — Telephone Encounter (Signed)
Received fax from [place] re: Tysabri reauth.  Called [facility] for JCV lab result, spoke with Raquel who will fax result. Result  received, JCV 0.36 final result negative.  Form filled, out, on MD desk for review, signature.

## 2021-08-21 NOTE — Telephone Encounter (Signed)
Tysabri reauth form completed, signed and faxed to [facility]. Received confirmation.

## 2021-08-22 NOTE — Telephone Encounter (Signed)
Received result from [facility].  JCV index value is 0.36 and JCV antibody is indeterminate. Final result paper place on MD's desk for review.

## 2022-02-07 ENCOUNTER — Telehealth: Payer: Self-pay | Admitting: *Deleted

## 2022-02-07 ENCOUNTER — Other Ambulatory Visit (INDEPENDENT_AMBULATORY_CARE_PROVIDER_SITE_OTHER): Payer: Self-pay

## 2022-02-07 ENCOUNTER — Other Ambulatory Visit: Payer: Self-pay | Admitting: Neurology

## 2022-02-07 DIAGNOSIS — Z0289 Encounter for other administrative examinations: Secondary | ICD-10-CM

## 2022-02-07 DIAGNOSIS — Z5181 Encounter for therapeutic drug level monitoring: Secondary | ICD-10-CM

## 2022-02-07 DIAGNOSIS — G35D Multiple sclerosis, unspecified: Secondary | ICD-10-CM

## 2022-02-07 NOTE — Telephone Encounter (Signed)
Patient in office and had JCV lab drawn, specimen placed in [facility] lock box for pick up.  ?

## 2022-02-08 LAB — CBC WITH DIFFERENTIAL/PLATELET
Basophils Absolute: 0.1 10*3/uL (ref 0.0–0.2)
Basos: 1 %
EOS (ABSOLUTE): 0.2 10*3/uL (ref 0.0–0.4)
Eos: 3 %
Hematocrit: 36.2 % (ref 34.0–46.6)
Hemoglobin: 12.1 g/dL (ref 11.1–15.9)
Immature Grans (Abs): 0 10*3/uL (ref 0.0–0.1)
Immature Granulocytes: 0 %
Lymphocytes Absolute: 3.1 10*3/uL (ref 0.7–3.1)
Lymphs: 37 %
MCH: 28.2 pg (ref 26.6–33.0)
MCHC: 33.4 g/dL (ref 31.5–35.7)
MCV: 84 fL (ref 79–97)
Monocytes Absolute: 0.8 10*3/uL (ref 0.1–0.9)
Monocytes: 9 %
NRBC: 3 % — ABNORMAL HIGH (ref 0–0)
Neutrophils Absolute: 4.2 10*3/uL (ref 1.4–7.0)
Neutrophils: 50 %
Platelets: 266 10*3/uL (ref 150–450)
RBC: 4.29 x10E6/uL (ref 3.77–5.28)
RDW: 14.2 % (ref 11.7–15.4)
WBC: 8.4 10*3/uL (ref 3.4–10.8)

## 2022-02-18 ENCOUNTER — Ambulatory Visit (INDEPENDENT_AMBULATORY_CARE_PROVIDER_SITE_OTHER): Payer: 59 | Admitting: Diagnostic Neuroimaging

## 2022-02-18 ENCOUNTER — Encounter: Payer: Self-pay | Admitting: Diagnostic Neuroimaging

## 2022-02-18 ENCOUNTER — Telehealth: Payer: Self-pay | Admitting: *Deleted

## 2022-02-18 VITALS — BP 139/91 | HR 91 | Ht 61.0 in | Wt 180.2 lb

## 2022-02-18 DIAGNOSIS — Z5181 Encounter for therapeutic drug level monitoring: Secondary | ICD-10-CM | POA: Diagnosis not present

## 2022-02-18 DIAGNOSIS — G35 Multiple sclerosis: Secondary | ICD-10-CM | POA: Diagnosis not present

## 2022-02-18 NOTE — Progress Notes (Signed)
? ? ? ?Chief Complaint  ?Patient presents with  ? Multiple Sclerosis  ?  Rm 6, 6 month FU  "next Tysabri infusion 03/07/22, doing well"  ? ?History of Present Illness: ? ?UPDATE (02/18/22, VRP): Since last visit, doing well. Symptoms are stable. Severity is mild. No alleviating or aggravating factors. Tolerating tysabri. Had covid earlier this year (cough, tachycardia, fatigue; 2 weeks). ? ?UPDATE (08/20/21, VRP): Since last visit, now on tysabri since Mar 22, 2021  doing well. MS and GI sxs stable.  ? ?UPDATE (02/13/21, VRP): Since last visit, having more injection site reactions from Copaxone which she started in November 2021.  Also having more generalized joint and body pains, more on the right side.  Also having some shooting pains from her lower back down her leg on the right side.  ? ?UPDATE (09/11/20 , VRP): Since last visit, doing well. Symptoms are stable. Severity is mild. No alleviating or aggravating factors. Tolerating meds. Lymphocytes in April 2021 were low again (0.2).  ? ?UPDATE (12/13/19, VRP): Since last visit, more stress / anxiety (mother in law living with them; work from home; pandemic) --> now more insomnia, right leg numbness, back spasms. Tolerating gilenya.  ? ?UPDATE (04/15/19, VRP): Since last visit, doing well. Symptoms a ?re stable. No new issues. No alleviating or aggravating factors. Tolerating gilenya. S/p cataract procedure. Crohn's is stable; on stellara. Annual physical is pending. Needs follow up labs. ? ?  ?Observations/Objective: ? ?GENERAL EXAM/CONSTITUTIONAL: ?Vitals:  ?Vitals:  ? 02/18/22 1616  ?BP: (!) 139/91  ?Pulse: 91  ?Weight: 180 lb 3.2 oz (81.7 kg)  ?Height: 5' 1"  (1.549 m)  ? ?Body mass index is 34.05 kg/m?. ?Wt Readings from Last 3 Encounters:  ?02/18/22 180 lb 3.2 oz (81.7 kg)  ?08/20/21 180 lb (81.6 kg)  ?02/13/21 177 lb 3.2 oz (80.4 kg)  ? ?Patient is in no distress; well developed, nourished and groomed; neck is supple ? ?CARDIOVASCULAR: ?Examination of carotid  arteries is normal; no carotid bruits ?Regular rate and rhythm, no murmurs ?Examination of peripheral vascular system by observation and palpation is normal ? ?EYES: ?Ophthalmoscopic exam of optic discs and posterior segments is normal; no papilledema or hemorrhages ?No results found. ? ?MUSCULOSKELETAL: ?Gait, strength, tone, movements noted in Neurologic exam below ? ?NEUROLOGIC: ?MENTAL STATUS:  ?   ? View : No data to display.  ?  ?  ?  ? ?awake, alert, oriented to person, place and time ?recent and remote memory intact ?normal attention and concentration ?language fluent, comprehension intact, naming intact ?fund of knowledge appropriate ? ?CRANIAL NERVE:  ?2nd - no papilledema on fundoscopic exam ?2nd, 3rd, 4th, 6th - pupils equal and reactive to light, visual fields full to confrontation, extraocular muscles intact, no nystagmus ?5th - facial sensation symmetric ?7th - facial strength symmetric ?8th - hearing intact ?9th - palate elevates symmetrically, uvula midline ?11th - shoulder shrug symmetric ?12th - tongue protrusion midline ? ?MOTOR:  ?normal bulk and tone, full strength in the BUE, BLE ? ?SENSORY:  ?normal and symmetric to light touch ? ?COORDINATION:  ?finger-nose-finger, fine finger movements normal ? ?REFLEXES:  ?deep tendon reflexes present and symmetric ? ?GAIT/STATION:  ?narrow based gait ? ?Lab Results  ?Component Value Date  ? WBC 8.4 02/07/2022  ? HGB 12.1 02/07/2022  ? HCT 36.2 02/07/2022  ? MCV 84 02/07/2022  ? PLT 266 02/07/2022  ? ? ?08/06/17 MRI brain (with and without) demonstrating: ?1. There are a few small, round and ovoid, periventricular  and subcortical chronic demyelinating plaques. ?2. No acute plaques. ?3. No significant change from MRI on 12/27/15. ? ?09/20/20 MRI brain with and without contrast demonstrating: ?-Stable periventricular, pericallosal, subcortical and juxtacortical chronic demyelinating plaques. ?-No acute or new plaques are seen. ? ? ?Assessment and Plan: ? ?51  y.o. year old female with multiple sclerosis. Paresthesias and swelling of her bilateral feet since Septemeber 2010, which have gradually improved. Diagnosed with multiple sclerosis by MRI brain, t-spine, labs and LP (12 OCBs). Was on rebif 04/27/10 until Feb 2012 (stopped b/c of possible rxns and also was trying to get pregnant). Now on gilenya and doing well. Also with inflammatory bowel disease (crohn's dz), now on Stelara (ustekinumab; every 2 months pen injection at home).  ? ?[place] [place] but stopped due to low lymphocyte counts (April 2021). Tried copaxone since Nov 2021, but having injection site reactions. Now on tysabri since April 2022.  ? ? ? ?Dx: ? ?1. MS (multiple sclerosis) ([place])   ?2. Encounter for therapeutic drug monitoring   ? ? ? ? ?MULTIPLE SCLEROSIS ?- continue tysabri ?- continue clinical monitoring ?- continue vitamin D ?- continue monitoring: CBC, CMP, JCV ab every 6; MRI annually (next April 2023) ? ?JOINT AND NERVE PAIN (resolved) ?- continue PT exercises ? ?Orders Placed This Encounter  ?Procedures  ? MR BRAIN W WO CONTRAST  ? ? ? ?Follow Up Instructions: ? ?- Return in about 6 months (around 08/21/2022). ? ? ? ? ? ? ?Penni Bombard, MD 03/11/4080, 4:48 PM ?Certified in Neurology, Neurophysiology and Neuroimaging ? ?[facility] ?[address], [address] ?[place], [place] [address] ?([phone] ? ?

## 2022-02-18 NOTE — Telephone Encounter (Signed)
Received Tysabri reauth from [facility]. Placed on MD desk for completion, signature. ?

## 2022-02-19 NOTE — Telephone Encounter (Signed)
Tysabri reauth form signed, faxed to [facility]. Received confirmation. ?

## 2022-02-20 ENCOUNTER — Telehealth: Payer: Self-pay | Admitting: Diagnostic Neuroimaging

## 2022-02-20 NOTE — Telephone Encounter (Signed)
[employer] auth: NPR via [employer] website order sent to [facility], they will reach out to the patient to schedule.  ?

## 2022-03-06 ENCOUNTER — Other Ambulatory Visit: Payer: 59

## 2022-03-06 NOTE — Telephone Encounter (Signed)
Patient is scheduled at [facility] for 03/12/22.  ?

## 2022-03-12 ENCOUNTER — Ambulatory Visit (INDEPENDENT_AMBULATORY_CARE_PROVIDER_SITE_OTHER): Payer: 59

## 2022-03-12 DIAGNOSIS — G35D Multiple sclerosis, unspecified: Secondary | ICD-10-CM

## 2022-03-12 DIAGNOSIS — Z5181 Encounter for therapeutic drug level monitoring: Secondary | ICD-10-CM

## 2022-03-12 DIAGNOSIS — G35 Multiple sclerosis: Secondary | ICD-10-CM

## 2022-03-12 MED ORDER — GADOBENATE DIMEGLUMINE 529 MG/ML IV SOLN
15.0000 mL | Freq: Once | INTRAVENOUS | Status: AC | PRN
  Administered 2022-03-12: 15 mL via INTRAVENOUS

## 2022-06-20 ENCOUNTER — Other Ambulatory Visit: Payer: Self-pay | Admitting: Obstetrics and Gynecology

## 2022-06-20 DIAGNOSIS — R9389 Abnormal findings on diagnostic imaging of other specified body structures: Secondary | ICD-10-CM

## 2022-07-02 ENCOUNTER — Ambulatory Visit
Admission: RE | Admit: 2022-07-02 | Discharge: 2022-07-02 | Disposition: A | Discharge status: Home / Self Care | Payer: 59 | Source: Ambulatory Visit | Attending: Obstetrics and Gynecology | Admitting: Obstetrics and Gynecology

## 2022-07-02 DIAGNOSIS — R9389 Abnormal findings on diagnostic imaging of other specified body structures: Secondary | ICD-10-CM

## 2022-07-02 MED ORDER — GADOBENATE DIMEGLUMINE 529 MG/ML IV SOLN
16.0000 mL | Freq: Once | INTRAVENOUS | Status: AC | PRN
  Administered 2022-07-02: 16 mL via INTRAVENOUS

## 2022-08-19 ENCOUNTER — Telehealth: Payer: Self-pay | Admitting: *Deleted

## 2022-08-19 NOTE — Telephone Encounter (Signed)
Received Tysabri reauth form from [facility]. On MD desk for review, completion, signature.

## 2022-08-20 NOTE — Telephone Encounter (Signed)
Tysabri reauth form completed, signed by MD. Virgel Gess to [place]. Received confirmation.

## 2022-08-21 ENCOUNTER — Encounter: Payer: Self-pay | Admitting: Diagnostic Neuroimaging

## 2022-08-21 ENCOUNTER — Other Ambulatory Visit: Payer: Self-pay | Admitting: *Deleted

## 2022-08-21 DIAGNOSIS — G35 Multiple sclerosis: Secondary | ICD-10-CM

## 2022-08-21 DIAGNOSIS — D508 Other iron deficiency anemias: Secondary | ICD-10-CM

## 2022-08-21 DIAGNOSIS — K50119 Crohn's disease of large intestine with unspecified complications: Secondary | ICD-10-CM

## 2022-08-21 DIAGNOSIS — E559 Vitamin D deficiency, unspecified: Secondary | ICD-10-CM

## 2022-08-21 DIAGNOSIS — Z5181 Encounter for therapeutic drug level monitoring: Secondary | ICD-10-CM

## 2022-08-21 DIAGNOSIS — K5010 Crohn's disease of large intestine without complications: Secondary | ICD-10-CM

## 2022-08-22 ENCOUNTER — Other Ambulatory Visit (INDEPENDENT_AMBULATORY_CARE_PROVIDER_SITE_OTHER): Payer: Self-pay

## 2022-08-22 DIAGNOSIS — G35 Multiple sclerosis: Secondary | ICD-10-CM

## 2022-08-22 DIAGNOSIS — Z0289 Encounter for other administrative examinations: Secondary | ICD-10-CM

## 2022-08-22 DIAGNOSIS — Z5181 Encounter for therapeutic drug level monitoring: Secondary | ICD-10-CM

## 2022-08-22 DIAGNOSIS — D508 Other iron deficiency anemias: Secondary | ICD-10-CM

## 2022-08-22 DIAGNOSIS — K50119 Crohn's disease of large intestine with unspecified complications: Secondary | ICD-10-CM

## 2022-08-22 DIAGNOSIS — E559 Vitamin D deficiency, unspecified: Secondary | ICD-10-CM

## 2022-08-22 DIAGNOSIS — K5010 Crohn's disease of large intestine without complications: Secondary | ICD-10-CM

## 2022-08-26 ENCOUNTER — Other Ambulatory Visit (INDEPENDENT_AMBULATORY_CARE_PROVIDER_SITE_OTHER): Payer: Self-pay

## 2022-08-26 ENCOUNTER — Other Ambulatory Visit: Payer: Self-pay | Admitting: Diagnostic Neuroimaging

## 2022-08-26 DIAGNOSIS — Z0289 Encounter for other administrative examinations: Secondary | ICD-10-CM

## 2022-09-03 ENCOUNTER — Encounter: Payer: Self-pay | Admitting: Diagnostic Neuroimaging

## 2022-09-03 ENCOUNTER — Ambulatory Visit (INDEPENDENT_AMBULATORY_CARE_PROVIDER_SITE_OTHER): Payer: 59 | Admitting: Diagnostic Neuroimaging

## 2022-09-03 VITALS — BP 140/84 | HR 84 | Ht 61.0 in | Wt 179.4 lb

## 2022-09-03 DIAGNOSIS — G35 Multiple sclerosis: Secondary | ICD-10-CM

## 2022-09-03 DIAGNOSIS — Z5181 Encounter for therapeutic drug level monitoring: Secondary | ICD-10-CM

## 2022-09-03 NOTE — Progress Notes (Signed)
Chief Complaint  Patient presents with   Follow-up    Pt reports feeling tired and sleepy. She reports getting spasms every now and then. Room 6 alone   History of Present Illness:  UPDATE (09/03/22, VRP): Since last visit, doing well. Symptoms are stable. Severity is mild. No alleviating or aggravating factors. Tolerating tysabri.    UPDATE (02/18/22, VRP): Since last visit, doing well. Symptoms are stable. Severity is mild. No alleviating or aggravating factors. Tolerating tysabri. Had covid earlier this year (cough, tachycardia, fatigue; 2 weeks).  UPDATE (08/20/21, VRP): Since last visit, now on tysabri since Mar 22, 2021  doing well. MS and GI sxs stable.   UPDATE (02/13/21, VRP): Since last visit, having more injection site reactions from Copaxone which she started in November 2021.  Also having more generalized joint and body pains, more on the right side.  Also having some shooting pains from her lower back down her leg on the right side.   UPDATE (09/11/20 , VRP): Since last visit, doing well. Symptoms are stable. Severity is mild. No alleviating or aggravating factors. Tolerating meds. Lymphocytes in April 2021 were low again (0.2).   UPDATE (12/13/19, VRP): Since last visit, more stress / anxiety (mother in law living with them; work from home; pandemic) --> now more insomnia, right leg numbness, back spasms. Tolerating gilenya.   UPDATE (04/15/19, VRP): Since last visit, doing well. Symptoms a re stable. No new issues. No alleviating or aggravating factors. Tolerating gilenya. S/p cataract procedure. Crohn's is stable; on stellara. Annual physical is pending. Needs follow up labs.    Observations/Objective:  GENERAL EXAM/CONSTITUTIONAL: Vitals:  Vitals:   09/03/22 1609  BP: (!) 140/84  Pulse: 84  Weight: 179 lb 6 oz (81.4 kg)  Height: 5' 1"  (1.549 m)   Body mass index is 33.89 kg/m. Wt Readings from Last 3 Encounters:  09/03/22 179 lb 6 oz (81.4 kg)  02/18/22 180  lb 3.2 oz (81.7 kg)  08/20/21 180 lb (81.6 kg)   Patient is in no distress; well developed, nourished and groomed; neck is supple  CARDIOVASCULAR: Examination of carotid arteries is normal; no carotid bruits Regular rate and rhythm, no murmurs Examination of peripheral vascular system by observation and palpation is normal  EYES: Ophthalmoscopic exam of optic discs and posterior segments is normal; no papilledema or hemorrhages No results found.  MUSCULOSKELETAL: Gait, strength, tone, movements noted in Neurologic exam below  NEUROLOGIC: MENTAL STATUS:      No data to display         awake, alert, oriented to person, place and time recent and remote memory intact normal attention and concentration language fluent, comprehension intact, naming intact fund of knowledge appropriate  CRANIAL NERVE:  2nd - no papilledema on fundoscopic exam 2nd, 3rd, 4th, 6th - pupils equal and reactive to light, visual fields full to confrontation, extraocular muscles intact, no nystagmus 5th - facial sensation symmetric 7th - facial strength symmetric 8th - hearing intact 9th - palate elevates symmetrically, uvula midline 11th - shoulder shrug symmetric 12th - tongue protrusion midline  MOTOR:  normal bulk and tone, full strength in the BUE, BLE  SENSORY:  normal and symmetric to light touch  COORDINATION:  finger-nose-finger, fine finger movements normal  REFLEXES:  deep tendon reflexes present and symmetric  GAIT/STATION:  narrow based gait  Lab Results  Component Value Date   WBC 10.0 08/26/2022   HGB 12.4 08/26/2022   HCT 38.2 08/26/2022   MCV 87  08/26/2022   PLT 277 08/26/2022    08/06/17 MRI brain (with and without) demonstrating: 1. There are a few small, round and ovoid, periventricular and subcortical chronic demyelinating plaques. 2. No acute plaques. 3. No significant change from MRI on 12/27/15.  09/20/20 MRI brain with and without contrast  demonstrating: -Stable periventricular, pericallosal, subcortical and juxtacortical chronic demyelinating plaques. -No acute or new plaques are seen.  03/12/22 MRI brain (with and without) demonstrating [I reviewed images myself and agree with interpretation. Will monitor and repeat in April 2024. -VRP]  - Multiple round and ovoid periventricular, subcortical and juxtacortical chronic demyelinating plaques.  No abnormal lesions are seen on post contrast views.  -Compared to MRI from 09/20/2020 there are 2 new chronic demyelinating plaques in the right frontal and right parietal region.   Assessment and Plan:  51 y.o. year old female with multiple sclerosis. Paresthesias and swelling of her bilateral feet since Septemeber 2010, which have gradually improved. Diagnosed with multiple sclerosis by MRI brain, t-spine, labs and LP (12 OCBs). Was on rebif 04/27/10 until Feb 2012 (stopped b/c of possible rxns and also was trying to get pregnant). Now on gilenya and doing well. Also with inflammatory bowel disease (crohn's dz), now on Stelara (ustekinumab; every 2 months pen injection at home).   Tried gilenya but stopped due to low lymphocyte counts (April 2021). Tried copaxone since Nov 2021, but having injection site reactions. Now on tysabri since April 2022.     Dx:  1. MS (multiple sclerosis) ([place])   2. Encounter for therapeutic drug monitoring        MULTIPLE SCLEROSIS - continue tysabri - continue clinical monitoring - continue vitamin D - continue monitoring: CBC, CMP, JCV ab every 6; MRI annually (next April 2024)  JOINT AND NERVE PAIN (resolved) - continue PT exercises   Follow Up Instructions:  - Return in about 6 months (around 03/05/2023) for with NP Butler Denmark).       Penni Bombard, MD 71/04/2693, 8:54 PM Certified in Neurology, Neurophysiology and Neuroimaging  [facility] [address], [place],  [place] [address] [phone]

## 2022-09-04 ENCOUNTER — Encounter: Payer: Self-pay | Admitting: Diagnostic Neuroimaging

## 2022-09-05 ENCOUNTER — Telehealth: Payer: Self-pay | Admitting: *Deleted

## 2022-09-05 NOTE — Telephone Encounter (Signed)
Patient reported she received Covid vaccine on 09/04/22, received flu vaccine on 08/26/22.

## 2022-09-09 LAB — COMPREHENSIVE METABOLIC PANEL
ALT: 17 IU/L (ref 0–32)
AST: 18 IU/L (ref 0–40)
Albumin/Globulin Ratio: 2.4 — ABNORMAL HIGH (ref 1.2–2.2)
Albumin: 4.5 g/dL (ref 3.9–4.9)
Alkaline Phosphatase: 65 IU/L (ref 44–121)
BUN/Creatinine Ratio: 14 (ref 9–23)
BUN: 11 mg/dL (ref 6–24)
Bilirubin Total: 0.3 mg/dL (ref 0.0–1.2)
CO2: 19 mmol/L — ABNORMAL LOW (ref 20–29)
Calcium: 9.5 mg/dL (ref 8.7–10.2)
Chloride: 107 mmol/L — ABNORMAL HIGH (ref 96–106)
Creatinine, Ser: 0.76 mg/dL (ref 0.57–1.00)
Globulin, Total: 1.9 g/dL (ref 1.5–4.5)
Glucose: 111 mg/dL — ABNORMAL HIGH (ref 70–99)
Potassium: 4.4 mmol/L (ref 3.5–5.2)
Sodium: 138 mmol/L (ref 134–144)
Total Protein: 6.4 g/dL (ref 6.0–8.5)
eGFR: 96 mL/min/{1.73_m2} (ref >59)

## 2022-09-09 LAB — CBC WITH DIFFERENTIAL/PLATELET
Basophils Absolute: 0.1 10*3/uL (ref 0.0–0.2)
Basos: 1 %
EOS (ABSOLUTE): 0.4 10*3/uL (ref 0.0–0.4)
Eos: 4 %
Hematocrit: 38.2 % (ref 34.0–46.6)
Hemoglobin: 12.4 g/dL (ref 11.1–15.9)
Immature Grans (Abs): 0.1 10*3/uL (ref 0.0–0.1)
Immature Granulocytes: 1 %
Lymphocytes Absolute: 3.7 10*3/uL — ABNORMAL HIGH (ref 0.7–3.1)
Lymphs: 37 %
MCH: 28.1 pg (ref 26.6–33.0)
MCHC: 32.5 g/dL (ref 31.5–35.7)
MCV: 87 fL (ref 79–97)
Monocytes Absolute: 0.7 10*3/uL (ref 0.1–0.9)
Monocytes: 7 %
NRBC: 3 % — ABNORMAL HIGH (ref 0–0)
Neutrophils Absolute: 5.1 10*3/uL (ref 1.4–7.0)
Neutrophils: 50 %
Platelets: 277 10*3/uL (ref 150–450)
RBC: 4.41 x10E6/uL (ref 3.77–5.28)
RDW: 13.6 % (ref 11.7–15.4)
WBC: 10 10*3/uL (ref 3.4–10.8)

## 2022-09-09 LAB — IRON,TIBC AND FERRITIN PANEL
Ferritin: 140 ng/mL (ref 15–150)
Iron Saturation: 15 % (ref 15–55)
Iron: 58 ug/dL (ref 27–159)
Total Iron Binding Capacity: 384 ug/dL (ref 250–450)
UIBC: 326 ug/dL (ref 131–425)

## 2022-09-09 LAB — STRATIFY JCV(TM) AB W/INDEX
JCV Antibody by Inhibition: NEGATIVE
JCV Index Value: 0.25

## 2022-09-09 LAB — C-REACTIVE PROTEIN: CRP: 6 mg/L (ref 0–10)

## 2022-09-09 LAB — VITAMIN D 25 HYDROXY (VIT D DEFICIENCY, FRACTURES): Vit D, 25-Hydroxy: 66.9 ng/mL (ref 30.0–100.0)

## 2022-09-18 NOTE — Telephone Encounter (Signed)
-----   Message from Penni Bombard, MD sent at 09/17/2022  5:53 PM EDT ----- Unremarkable labs. Continue current plan. Please call patient. -VRP

## 2023-02-13 ENCOUNTER — Other Ambulatory Visit: Payer: Self-pay | Admitting: Neurology

## 2023-02-13 ENCOUNTER — Other Ambulatory Visit: Payer: Self-pay

## 2023-02-13 ENCOUNTER — Telehealth: Payer: Self-pay | Admitting: Neurology

## 2023-02-13 DIAGNOSIS — G35 Multiple sclerosis: Secondary | ICD-10-CM

## 2023-02-13 DIAGNOSIS — Z5181 Encounter for therapeutic drug level monitoring: Secondary | ICD-10-CM

## 2023-02-13 NOTE — Telephone Encounter (Signed)
Placed JCV lab in [facility] lock box for routine lab pick up. Results pending. 

## 2023-02-14 LAB — CBC WITH DIFFERENTIAL/PLATELET
Basophils Absolute: 0.1 10*3/uL (ref 0.0–0.2)
Basos: 1 %
EOS (ABSOLUTE): 0.2 10*3/uL (ref 0.0–0.4)
Eos: 3 %
Hematocrit: 41 % (ref 34.0–46.6)
Hemoglobin: 13.1 g/dL (ref 11.1–15.9)
Immature Grans (Abs): 0 10*3/uL (ref 0.0–0.1)
Immature Granulocytes: 0 %
Lymphocytes Absolute: 3.2 10*3/uL — ABNORMAL HIGH (ref 0.7–3.1)
Lymphs: 37 %
MCH: 27.8 pg (ref 26.6–33.0)
MCHC: 32 g/dL (ref 31.5–35.7)
MCV: 87 fL (ref 79–97)
Monocytes Absolute: 0.7 10*3/uL (ref 0.1–0.9)
Monocytes: 7 %
NRBC: 1 % — ABNORMAL HIGH (ref 0–0)
Neutrophils Absolute: 4.6 10*3/uL (ref 1.4–7.0)
Neutrophils: 52 %
Platelets: 254 10*3/uL (ref 150–450)
RBC: 4.71 x10E6/uL (ref 3.77–5.28)
RDW: 14.1 % (ref 11.7–15.4)
WBC: 8.7 10*3/uL (ref 3.4–10.8)

## 2023-02-19 NOTE — Telephone Encounter (Signed)
Recevied JCV result from [place].   Will send to Dr Leta Baptist to review.

## 2023-03-19 NOTE — Progress Notes (Signed)
Patient: Ashley Diaz Date of Birth: 06-12-1971  Reason for Visit: Follow up History from: Patient Primary Neurologist: [facility]   ASSESSMENT AND PLAN 52 y.o.  female with multiple sclerosis. Paresthesias and swelling of her bilateral feet since Septemeber 2010, which have gradually improved. Diagnosed with multiple sclerosis by MRI brain, t-spine, labs and LP (12 OCBs). Was on rebif 04/27/10 until Feb 2012 (stopped b/c of possible rxns and also was trying to get pregnant). Now on gilenya and doing well. Also with inflammatory bowel disease (crohn's dz), now on Stelara (ustekinumab; every 2 months pen injection at home).    Tried gilenya but stopped due to low lymphocyte counts (April 2021). Tried copaxone since Nov 2021, but having injection site reactions. Now on tysabri since April 2022.  1.  Multiple sclerosis -Overall stable, denies any new or worsening symptoms -Continue Tysabri -In March 2024 JCV 0.37 indeterminate, negative inhibition assay -Check MRI of the brain with and without contrast for subclinical progression, check CMP before   -MRI of the brain with and without contrast April 2023 showed 2 new plaques in the right frontal and right parietal region compared to October 2021 scan -Encouraged to continue exercise, remains on vitamin D supplement -Will need CBC, CMP, JCV every 6 months -Follow-up in 6 months or sooner if needed   HISTORY OF PRESENT ILLNESS: Today 03/20/23 Update 03/20/23 SS: JCV 02/19/23 0.37 indeterminate, negative inhibition assay. Remains on Tysabri, comes to our office. No issues. Feels MS doing fairly well. Joints in left hand hurt occasionally. Can have back spasms. Works full time, is an Art gallery manager, works remotely. Has been trying to walk daily. On Vitamin D. On Ozempic, lost 7 lbs, is now pre-diabetic.   HISTORY  UPDATE (09/03/22, VRP): Since last visit, doing well. Symptoms are stable. Severity is mild. No alleviating or aggravating factors.  Tolerating tysabri.     UPDATE (02/18/22, VRP): Since last visit, doing well. Symptoms are stable. Severity is mild. No alleviating or aggravating factors. Tolerating tysabri. Had covid earlier this year (cough, tachycardia, fatigue; 2 weeks).   UPDATE (08/20/21, VRP): Since last visit, now on tysabri since Mar 22, 2021  doing well. MS and GI sxs stable.    UPDATE (02/13/21, VRP): Since last visit, having more injection site reactions from Copaxone which she started in November 2021.  Also having more generalized joint and body pains, more on the right side.  Also having some shooting pains from her lower back down her leg on the right side.    UPDATE (09/11/20 , VRP): Since last visit, doing well. Symptoms are stable. Severity is mild. No alleviating or aggravating factors. Tolerating meds. Lymphocytes in April 2021 were low again (0.2).    UPDATE (12/13/19, VRP): Since last visit, more stress / anxiety (mother in law living with them; work from home; pandemic) --> now more insomnia, right leg numbness, back spasms. Tolerating gilenya.    UPDATE (04/15/19, VRP): Since last visit, doing well. Symptoms a re stable. No new issues. No alleviating or aggravating factors. Tolerating gilenya. S/p cataract procedure. Crohn's is stable; on stellara. Annual physical is pending. Needs follow up labs.    REVIEW OF SYSTEMS: Out of a complete 14 system review of symptoms, the patient complains only of the following symptoms, and all other reviewed systems are negative.  See HPI  ALLERGIES: Allergies  Allergen Reactions   Codeine Hives   Other Hives    Pt unsure Rebif Pt unsure    Interferon Beta-1a  Other reaction(s): hives   Penicillins Hives    HOME MEDICATIONS: Outpatient Medications Prior to Visit  Medication Sig Dispense Refill   Ascorbic Acid (VITAMIN C) 1000 MG tablet Take 2,000 mg by mouth.     Cholecalciferol 5000 units capsule Take by mouth daily.     levocetirizine (XYZAL) 5 MG  tablet Take 5 mg by mouth every evening.     lisinopril (ZESTRIL) 10 MG tablet Take 10 mg by mouth daily.     Multiple Vitamins-Minerals (MULTIPLE VITAMINS/WOMENS PO) daily.     natalizumab (TYSABRI) 300 MG/15ML injection Tysabri 300 mg/15 mL intravenous solution   1 mg every month by intraven. route.     OZEMPIC, 0.25 OR 0.5 MG/DOSE, 2 MG/3ML SOPN SMARTSIG:0.25 Milligram(s) SUB-Q Once a Week     tiZANidine (ZANAFLEX) 2 MG tablet Take 1-2 tablets (2-4 mg total) by mouth 2 (two) times daily as needed for muscle spasms. 30 tablet 1   valACYclovir (VALTREX) 500 MG tablet Take 500 mg by mouth 2 (two) times daily.     No facility-administered medications prior to visit.    PAST MEDICAL HISTORY: Past Medical History:  Diagnosis Date   Allergy    Crohn's disease (HCC)    Endometriosis    Herpes simplex without mention of complication    Hypertension    Migraine    Multiple sclerosis (HCC)     PAST SURGICAL HISTORY: Past Surgical History:  Procedure Laterality Date   CESAREAN SECTION     CRYOTHERAPY     FOOT SURGERY     LAPAROSCOPY     LASIK Bilateral 2001   UMBILICAL HERNIA REPAIR      FAMILY HISTORY: Family History  Problem Relation Age of Onset   Diabetes Mother    Glaucoma Mother    Diabetes Father    Cancer Father        throat, lung with metastases   Diabetes Sister    Multiple sclerosis Sister    Glaucoma Other    Colon cancer Paternal Grandfather    Retinal detachment Paternal Grandfather     SOCIAL HISTORY: Social History   Socioeconomic History   Marital status: Married    Spouse name: Teacher, music   Number of children: 1   Years of education: Not on file   Highest education level: Not on file  Occupational History   Occupation: Paramedic: [employer]    Comment: [employer]  Tobacco Use   Smoking status: Never   Smokeless tobacco: Never  Substance and Sexual Activity   Alcohol use: No    Comment: 1-2 glasses monthly   Drug  use: No   Sexual activity: Yes    Birth control/protection: None  Other Topics Concern   Not on file  Social History Narrative   Patient lives at home with her spouse.   Caffeine Use:  1 beverage daily   Social Determinants of Health   Financial Resource Strain: Not on file  Food Insecurity: Not on file  Transportation Needs: Not on file  Physical Activity: Not on file  Stress: Not on file  Social Connections: Not on file  Intimate Partner Violence: Not on file    PHYSICAL EXAM  Vitals:   03/20/23 1451  BP: (!) 141/89  Pulse: 62  Weight: 174 lb (78.9 kg)  Height: 5\' 1"  (1.549 m)   Body mass index is 32.88 kg/m.  Generalized: Well developed, in no acute distress  Neurological examination  Mentation:  Alert oriented to time, place, history taking. Follows all commands speech and language fluent Cranial nerve II-XII: Pupils were equal round reactive to light. Extraocular movements were full, visual field were full on confrontational test. Facial sensation and strength were normal.  Head turning and shoulder shrug  were normal and symmetric. Motor: The motor testing reveals 5 over 5 strength of all 4 extremities. Good symmetric motor tone is noted throughout.  Sensory: Sensory testing is intact to soft touch on all 4 extremities. No evidence of extinction is noted.  Coordination: Cerebellar testing reveals good finger-nose-finger and heel-to-shin bilaterally.  Gait and station: Gait is normal. Tandem gait is normal.  Reflexes: Deep tendon reflexes are symmetric and normal bilaterally.   DIAGNOSTIC DATA (LABS, IMAGING, TESTING) - I reviewed patient records, labs, notes, testing and imaging myself where available.  Lab Results  Component Value Date   WBC 8.7 02/13/2023   HGB 13.1 02/13/2023   HCT 41.0 02/13/2023   MCV 87 02/13/2023   PLT 254 02/13/2023      Component Value Date/Time   NA 138 08/26/2022 0846   K 4.4 08/26/2022 0846   CL 107 (H) 08/26/2022 0846   CO2  19 (L) 08/26/2022 0846   GLUCOSE 111 (H) 08/26/2022 0846   GLUCOSE 88 07/03/2007 0530   BUN 11 08/26/2022 0846   CREATININE 0.76 08/26/2022 0846   CALCIUM 9.5 08/26/2022 0846   PROT 6.4 08/26/2022 0846   ALBUMIN 4.5 08/26/2022 0846   AST 18 08/26/2022 0846   ALT 17 08/26/2022 0846   ALKPHOS 65 08/26/2022 0846   BILITOT 0.3 08/26/2022 0846   GFRNONAA 102 08/11/2019 1614   GFRAA 118 08/11/2019 1614   No results found for: "CHOL", "HDL", "LDLCALC", "LDLDIRECT", "TRIG", "CHOLHDL" No results found for: "HGBA1C" No results found for: "VITAMINB12" No results found for: "TSH"  Margie Ege, AGNP-C, DNP 03/20/2023, 3:17 PM [facility] [address] [place], [place] [address] [phone]

## 2023-03-20 ENCOUNTER — Ambulatory Visit (INDEPENDENT_AMBULATORY_CARE_PROVIDER_SITE_OTHER): Payer: 59 | Admitting: Neurology

## 2023-03-20 VITALS — BP 141/89 | HR 62 | Ht 61.0 in | Wt 174.0 lb

## 2023-03-20 DIAGNOSIS — G35 Multiple sclerosis: Secondary | ICD-10-CM | POA: Diagnosis not present

## 2023-03-20 NOTE — Patient Instructions (Signed)
Stay on Tysabri,  Check MRI brain  Check kidney function  See you back in 6 months

## 2023-03-21 LAB — COMPREHENSIVE METABOLIC PANEL
ALT: 18 IU/L (ref 0–32)
AST: 14 IU/L (ref 0–40)
Albumin/Globulin Ratio: 2.4 — ABNORMAL HIGH (ref 1.2–2.2)
Albumin: 4.7 g/dL (ref 3.9–4.9)
Alkaline Phosphatase: 91 IU/L (ref 44–121)
BUN/Creatinine Ratio: 15 (ref 9–23)
BUN: 11 mg/dL (ref 6–24)
Bilirubin Total: 0.5 mg/dL (ref 0.0–1.2)
CO2: 20 mmol/L (ref 20–29)
Calcium: 10.2 mg/dL (ref 8.7–10.2)
Chloride: 102 mmol/L (ref 96–106)
Creatinine, Ser: 0.75 mg/dL (ref 0.57–1.00)
Globulin, Total: 2 g/dL (ref 1.5–4.5)
Glucose: 80 mg/dL (ref 70–99)
Potassium: 4.2 mmol/L (ref 3.5–5.2)
Sodium: 138 mmol/L (ref 134–144)
Total Protein: 6.7 g/dL (ref 6.0–8.5)
eGFR: 97 mL/min/{1.73_m2} (ref >59)

## 2023-03-25 ENCOUNTER — Ambulatory Visit (INDEPENDENT_AMBULATORY_CARE_PROVIDER_SITE_OTHER): Payer: 59

## 2023-03-25 DIAGNOSIS — G35 Multiple sclerosis: Secondary | ICD-10-CM

## 2023-03-25 MED ORDER — GADOBENATE DIMEGLUMINE 529 MG/ML IV SOLN
15.0000 mL | Freq: Once | INTRAVENOUS | Status: AC | PRN
  Administered 2023-03-25: 15 mL via INTRAVENOUS

## 2023-08-14 ENCOUNTER — Other Ambulatory Visit: Payer: Self-pay | Admitting: Anesthesiology

## 2023-08-14 ENCOUNTER — Other Ambulatory Visit (INDEPENDENT_AMBULATORY_CARE_PROVIDER_SITE_OTHER): Payer: Self-pay

## 2023-08-14 ENCOUNTER — Telehealth: Payer: Self-pay | Admitting: Anesthesiology

## 2023-08-14 DIAGNOSIS — G35 Multiple sclerosis: Secondary | ICD-10-CM

## 2023-08-14 DIAGNOSIS — Z5181 Encounter for therapeutic drug level monitoring: Secondary | ICD-10-CM

## 2023-08-14 DIAGNOSIS — Z0289 Encounter for other administrative examinations: Secondary | ICD-10-CM

## 2023-08-14 NOTE — Telephone Encounter (Signed)
Placed JCV lab in [facility] lock box for routine lab pick up. Results pending.

## 2023-08-15 LAB — CBC WITH DIFFERENTIAL/PLATELET
Basophils Absolute: 0.1 10*3/uL (ref 0.0–0.2)
Basos: 1 %
EOS (ABSOLUTE): 0.3 10*3/uL (ref 0.0–0.4)
Eos: 3 %
Hematocrit: 37.7 % (ref 34.0–46.6)
Hemoglobin: 12.4 g/dL (ref 11.1–15.9)
Immature Grans (Abs): 0 10*3/uL (ref 0.0–0.1)
Immature Granulocytes: 0 %
Lymphocytes Absolute: 3.9 10*3/uL — ABNORMAL HIGH (ref 0.7–3.1)
Lymphs: 39 %
MCH: 28.4 pg (ref 26.6–33.0)
MCHC: 32.9 g/dL (ref 31.5–35.7)
MCV: 87 fL (ref 79–97)
Monocytes Absolute: 0.5 10*3/uL (ref 0.1–0.9)
Monocytes: 5 %
NRBC: 1 % — ABNORMAL HIGH (ref 0–0)
Neutrophils Absolute: 5.2 10*3/uL (ref 1.4–7.0)
Neutrophils: 52 %
Platelets: 208 10*3/uL (ref 150–450)
RBC: 4.36 x10E6/uL (ref 3.77–5.28)
RDW: 13.6 % (ref 11.7–15.4)
WBC: 10 10*3/uL (ref 3.4–10.8)

## 2023-08-25 NOTE — Telephone Encounter (Signed)
Received the results from the JCV. Index value:0.40 (H) Antibody indeterminate  Inhibition assay-negative

## 2023-10-07 ENCOUNTER — Encounter: Payer: Self-pay | Admitting: Neurology

## 2023-10-07 ENCOUNTER — Ambulatory Visit (INDEPENDENT_AMBULATORY_CARE_PROVIDER_SITE_OTHER): Payer: 59 | Admitting: Neurology

## 2023-10-07 VITALS — BP 155/91 | HR 83 | Resp 15 | Ht 61.0 in | Wt 167.4 lb

## 2023-10-07 DIAGNOSIS — G35D Multiple sclerosis, unspecified: Secondary | ICD-10-CM

## 2023-10-07 DIAGNOSIS — G35 Multiple sclerosis: Secondary | ICD-10-CM

## 2023-10-07 MED ORDER — TIZANIDINE HCL 2 MG PO TABS
2.0000 mg | ORAL_TABLET | Freq: Two times a day (BID) | ORAL | 1 refills | Status: AC | PRN
Start: 2023-10-07 — End: ?

## 2023-10-07 NOTE — Progress Notes (Signed)
Patient: Ashley Diaz Date of Birth: Nov 19, 1971  Reason for Visit: Follow up History from: Patient Primary Neurologist: Penumalli   ASSESSMENT AND PLAN 52 y.o.  female with multiple sclerosis. Paresthesias and swelling of her bilateral feet since Septemeber 2010, which have gradually improved. Diagnosed with multiple sclerosis by MRI brain, t-spine, labs and LP (12 OCBs). Was on rebif 04/27/10 until Feb 2012 (stopped b/c of possible rxns and also was trying to get pregnant). Now on gilenya and doing well. Also with inflammatory bowel disease (crohn's dz), now on Stelara (ustekinumab; every 2 months pen injection at home).    Tried gilenya but stopped due to low lymphocyte counts (April 2021). Tried copaxone since Nov 2021, but having injection site reactions. Now on tysabri since April 2022.  1.  Multiple sclerosis -Overall stable, denies any new or worsening symptoms -Continue Tysabri -In September 2024 JCV 0.40 indeterminate, negative inhibition assay -MRI of the brain with and without contrast April 2024 showed overall stable MS lesions, no significant change compared to April 2023  -MRI of the brain with and without contrast April 2023 showed 2 new plaques in the right frontal and right parietal region compared to October 2021 scan -Encouraged to continue exercise, remains on vitamin D supplement -Will need CBC, CMP, JCV every 6 months -I will refill her tizanidine as needed for muscle spasms -Follow-up in 6 months or sooner if needed, will check MRI brain next visit  Meds ordered this encounter  Medications   tiZANidine (ZANAFLEX) 2 MG tablet    Sig: Take 1-2 tablets (2-4 mg total) by mouth 2 (two) times daily as needed for muscle spasms.    Dispense:  30 tablet    Refill:  1   HISTORY OF PRESENT ILLNESS: Today 10/07/23 MRI of the brain with and without contrast April 2024 showed no significant change. JCV 0.40 negative 08/14/23. TMJ issues, going to PT doing dry needling,  mouth guard at night. MS is stable. Remains on Tysabri.  Has occasional back spasms, aching to left wrist.  Update 03/20/23 SS: JCV 02/19/23 0.37 indeterminate, negative inhibition assay. Remains on Tysabri, comes to our office. No issues. Feels MS doing fairly well. Joints in left hand hurt occasionally. Can have back spasms. Works full time, is an Art gallery manager, works remotely. Has been trying to walk daily. On Vitamin D. On Ozempic, lost 7 lbs, is now pre-diabetic.   HISTORY  UPDATE (09/03/22, VRP): Since last visit, doing well. Symptoms are stable. Severity is mild. No alleviating or aggravating factors. Tolerating tysabri.     UPDATE (02/18/22, VRP): Since last visit, doing well. Symptoms are stable. Severity is mild. No alleviating or aggravating factors. Tolerating tysabri. Had covid earlier this year (cough, tachycardia, fatigue; 2 weeks).   UPDATE (08/20/21, VRP): Since last visit, now on tysabri since Mar 22, 2021  doing well. MS and GI sxs stable.    UPDATE (02/13/21, VRP): Since last visit, having more injection site reactions from Copaxone which she started in November 2021.  Also having more generalized joint and body pains, more on the right side.  Also having some shooting pains from her lower back down her leg on the right side.    UPDATE (09/11/20 , VRP): Since last visit, doing well. Symptoms are stable. Severity is mild. No alleviating or aggravating factors. Tolerating meds. Lymphocytes in April 2021 were low again (0.2).    UPDATE (12/13/19, VRP): Since last visit, more stress / anxiety (mother in law living with them; work  from home; pandemic) --> now more insomnia, right leg numbness, back spasms. Tolerating gilenya.    UPDATE (04/15/19, VRP): Since last visit, doing well. Symptoms a re stable. No new issues. No alleviating or aggravating factors. Tolerating gilenya. S/p cataract procedure. Crohn's is stable; on stellara. Annual physical is pending. Needs follow up labs.    REVIEW OF  SYSTEMS: Out of a complete 14 system review of symptoms, the patient complains only of the following symptoms, and all other reviewed systems are negative.  See HPI  ALLERGIES: Allergies  Allergen Reactions   Codeine Hives   Other Hives    Pt unsure Rebif Pt unsure    Interferon Beta-1a     Other reaction(s): hives   Penicillins Hives    HOME MEDICATIONS: Outpatient Medications Prior to Visit  Medication Sig Dispense Refill   Ascorbic Acid (VITAMIN C) 1000 MG tablet Take 2,000 mg by mouth.     Cholecalciferol 5000 units capsule Take by mouth daily.     levocetirizine (XYZAL) 5 MG tablet Take 5 mg by mouth every evening.     lisinopril (ZESTRIL) 10 MG tablet Take 10 mg by mouth daily.     Multiple Vitamins-Minerals (MULTIPLE VITAMINS/WOMENS PO) daily.     natalizumab (TYSABRI) 300 MG/15ML injection Tysabri 300 mg/15 mL intravenous solution   1 mg every month by intraven. route.     OZEMPIC, 0.25 OR 0.5 MG/DOSE, 2 MG/3ML SOPN SMARTSIG:0.25 Milligram(s) SUB-Q Once a Week     tiZANidine (ZANAFLEX) 2 MG tablet Take 1-2 tablets (2-4 mg total) by mouth 2 (two) times daily as needed for muscle spasms. 30 tablet 1   valACYclovir (VALTREX) 500 MG tablet Take 500 mg by mouth 2 (two) times daily.     No facility-administered medications prior to visit.    PAST MEDICAL HISTORY: Past Medical History:  Diagnosis Date   Allergy    Crohn's disease (HCC)    Endometriosis    Herpes simplex without mention of complication    Hypertension    Migraine    Multiple sclerosis (HCC)     PAST SURGICAL HISTORY: Past Surgical History:  Procedure Laterality Date   CESAREAN SECTION     CRYOTHERAPY     FOOT SURGERY     LAPAROSCOPY     LASIK Bilateral 2001   UMBILICAL HERNIA REPAIR      FAMILY HISTORY: Family History  Problem Relation Age of Onset   Diabetes Mother    Glaucoma Mother    Diabetes Father    Cancer Father        throat, lung with metastases   Diabetes Sister    Multiple  sclerosis Sister    Glaucoma Other    Colon cancer Paternal Grandfather    Retinal detachment Paternal Grandfather     SOCIAL HISTORY: Social History   Socioeconomic History   Marital status: Married    Spouse name: Teacher, music   Number of children: 1   Years of education: Not on file   Highest education level: Not on file  Occupational History   Occupation: Paramedic: [employer]    Comment: RFMD  Tobacco Use   Smoking status: Never   Smokeless tobacco: Never  Substance and Sexual Activity   Alcohol use: No    Comment: 1-2 glasses monthly   Drug use: No   Sexual activity: Yes    Birth control/protection: None  Other Topics Concern   Not on file  Social History  Narrative   Patient lives at home with her spouse.   Caffeine Use:  1 beverage daily   Social Determinants of Health   Financial Resource Strain: Not on file  Food Insecurity: Low Risk  (05/13/2023)   Received from [facility]   Hunger Vital Sign    Worried About Running Out of Food in the Last Year: Never true    Within the past 12 months, the food you bought just didn't last and you didn't have money to get more: Not on file  Transportation Needs: No Transportation Needs (05/13/2023)   Received from [facility]    In the past 12 months, has lack of reliable transportation kept you from medical appointments, meetings, work or from getting things needed for daily living? : No  Physical Activity: Not on file  Stress: Not on file  Social Connections: Not on file  Intimate Partner Violence: Not on file    PHYSICAL EXAM  Vitals:   10/07/23 1521  BP: (!) 155/91  Pulse: 83  Resp: 15  Weight: 167 lb 6.4 oz (75.9 kg)  Height: 5\' 1"  (1.549 m)    Body mass index is 31.63 kg/m.  Generalized: Well developed, in no acute distress  Neurological examination  Mentation: Alert oriented to time, place, history taking. Follows all commands speech and language  fluent Cranial nerve II-XII: Pupils were equal round reactive to light. Extraocular movements were full, visual field were full on confrontational test. Facial sensation and strength were normal.  Head turning and shoulder shrug  were normal and symmetric. Motor: The motor testing reveals 5 over 5 strength of all 4 extremities. Good symmetric motor tone is noted throughout.  Sensory: Sensory testing is intact to soft touch on all 4 extremities. No evidence of extinction is noted.  Coordination: Cerebellar testing reveals good finger-nose-finger and heel-to-shin bilaterally.  Gait and station: Gait is normal. Reflexes: Deep tendon reflexes are symmetric and normal bilaterally.   DIAGNOSTIC DATA (LABS, IMAGING, TESTING) - I reviewed patient records, labs, notes, testing and imaging myself where available.  Lab Results  Component Value Date   WBC 10.0 08/14/2023   HGB 12.4 08/14/2023   HCT 37.7 08/14/2023   MCV 87 08/14/2023   PLT 208 08/14/2023      Component Value Date/Time   NA 138 03/20/2023 1535   K 4.2 03/20/2023 1535   CL 102 03/20/2023 1535   CO2 20 03/20/2023 1535   GLUCOSE 80 03/20/2023 1535   GLUCOSE 88 07/03/2007 0530   BUN 11 03/20/2023 1535   CREATININE 0.75 03/20/2023 1535   CALCIUM 10.2 03/20/2023 1535   PROT 6.7 03/20/2023 1535   ALBUMIN 4.7 03/20/2023 1535   AST 14 03/20/2023 1535   ALT 18 03/20/2023 1535   ALKPHOS 91 03/20/2023 1535   BILITOT 0.5 03/20/2023 1535   GFRNONAA 102 08/11/2019 1614   GFRAA 118 08/11/2019 1614   No results found for: "CHOL", "HDL", "LDLCALC", "LDLDIRECT", "TRIG", "CHOLHDL" No results found for: "HGBA1C" No results found for: "VITAMINB12" No results found for: "TSH"  Margie Ege, AGNP-C, DNP 10/07/2023, 3:23 PM [facility] [address] [place], [place] [address] [phone]

## 2023-10-07 NOTE — Patient Instructions (Signed)
We will plan to check MRI of the brain at next visit.  I will contact you around March for labs related to Tysabri.  Please let me know if you need anything.  See you in 6 months.  Thanks

## 2024-01-27 ENCOUNTER — Telehealth: Payer: Self-pay | Admitting: Neurology

## 2024-01-27 DIAGNOSIS — G35 Multiple sclerosis: Secondary | ICD-10-CM

## 2024-01-27 NOTE — Telephone Encounter (Signed)
 Called pt to let her know that Maralyn Sago would like her to come in for labs on Tysabri. Pt stated she will come in on 3/12 when she gets her infusion.

## 2024-01-27 NOTE — Telephone Encounter (Signed)
 Patient is due for labs on Tysabri. Please ask her to come by sometime this month to have collected. I will place the orders. Thanks   Orders Placed This Encounter  Procedures   CBC with Differential/Platelet   CMP   Stratify JCV Ab (w/ Index) w/ Rflx

## 2024-02-04 ENCOUNTER — Other Ambulatory Visit: Payer: Self-pay

## 2024-02-04 ENCOUNTER — Other Ambulatory Visit: Payer: Self-pay | Admitting: Neurology

## 2024-02-04 DIAGNOSIS — E559 Vitamin D deficiency, unspecified: Secondary | ICD-10-CM

## 2024-02-04 DIAGNOSIS — Z5181 Encounter for therapeutic drug level monitoring: Secondary | ICD-10-CM

## 2024-02-04 DIAGNOSIS — G35 Multiple sclerosis: Secondary | ICD-10-CM

## 2024-02-04 DIAGNOSIS — D508 Other iron deficiency anemias: Secondary | ICD-10-CM

## 2024-02-05 ENCOUNTER — Telehealth: Payer: Self-pay | Admitting: Neurology

## 2024-02-05 LAB — CBC WITH DIFFERENTIAL/PLATELET
Basophils Absolute: 0.1 10*3/uL (ref 0.0–0.2)
Basos: 1 %
EOS (ABSOLUTE): 0.2 10*3/uL (ref 0.0–0.4)
Eos: 3 %
Hematocrit: 39.7 % (ref 34.0–46.6)
Hemoglobin: 12.9 g/dL (ref 11.1–15.9)
Immature Grans (Abs): 0 10*3/uL (ref 0.0–0.1)
Immature Granulocytes: 0 %
Lymphocytes Absolute: 3.4 10*3/uL — ABNORMAL HIGH (ref 0.7–3.1)
Lymphs: 39 %
MCH: 28.8 pg (ref 26.6–33.0)
MCHC: 32.5 g/dL (ref 31.5–35.7)
MCV: 89 fL (ref 79–97)
Monocytes Absolute: 0.5 10*3/uL (ref 0.1–0.9)
Monocytes: 6 %
NRBC: 1 % — ABNORMAL HIGH (ref 0–0)
Neutrophils Absolute: 4.5 10*3/uL (ref 1.4–7.0)
Neutrophils: 51 %
Platelets: 212 10*3/uL (ref 150–450)
RBC: 4.48 x10E6/uL (ref 3.77–5.28)
RDW: 13.4 % (ref 11.7–15.4)
WBC: 8.7 10*3/uL (ref 3.4–10.8)

## 2024-02-05 NOTE — Progress Notes (Signed)
 Noted.

## 2024-02-05 NOTE — Telephone Encounter (Signed)
 Late entry- pt had JCV collected on 02/04/24. Was [facility] in [facility] box for pick up.

## 2024-02-23 NOTE — Telephone Encounter (Signed)
 MD has review results

## 2024-04-20 ENCOUNTER — Telehealth: Payer: Self-pay | Admitting: Neurology

## 2024-04-20 NOTE — Telephone Encounter (Signed)
 Pt called requesting to be seen this week. Pt states that's she feeling she pinched a nerve and have been feeling like this pain  for a week .  The Pain is coming down from her  neck to shoulder then down her  arm to finger .She has been taking the medication that has been prescribed but that have not been helping

## 2024-04-20 NOTE — Telephone Encounter (Signed)
 Call to patient, advised that new referral would be needed for eval of pinch nerve. Patient verbalized understanding.

## 2024-04-22 ENCOUNTER — Other Ambulatory Visit: Payer: Self-pay | Admitting: Family Medicine

## 2024-04-22 DIAGNOSIS — M5412 Radiculopathy, cervical region: Secondary | ICD-10-CM

## 2024-04-26 NOTE — Progress Notes (Signed)
 Patient: Ashley Diaz Date of Birth: [birth year]  Reason for Visit: Follow up History from: Patient Primary Neurologist: Penumalli   ASSESSMENT AND PLAN 53 y.o.  female with multiple sclerosis. Paresthesias and swelling of her bilateral feet since Septemeber 2010, which have gradually improved. Diagnosed with multiple sclerosis by MRI brain, t-spine, labs and LP (12 OCBs). Was on rebif 04/27/10 until Feb 2012 (stopped b/c of possible rxns and also was trying to get pregnant). Now on gilenya  and doing well. Also with inflammatory bowel disease (crohn's dz), now on Stelara (ustekinumab; every 2 months pen injection at home).    Tried gilenya  but stopped due to low lymphocyte counts (April 2021). Tried copaxone since Nov 2021, but having injection site reactions. Now on tysabri since April 2022.  1.  Multiple sclerosis - Overall MS is stable, continue Tysabri - Check annual MRI of the brain with and without contrast - Due for labs September 2025, I sent myself a reminder, March 2025 JCV 0.29 indeterminate, inhibition assay negative -MRI of the brain with and without contrast April 2024 showed overall stable MS lesions, no significant change compared to April 2023  -MRI of the brain with and without contrast April 2023 showed 2 new plaques in the right frontal and right parietal region compared to October 2021 scan  -Encouraged to continue exercise, remains on vitamin D  supplement - Continue tizanidine  as needed for muscle spasms -I will refill her tizanidine  as needed for muscle spasms - Follow-up with Dr. Salli Crawley in 6 months to rotate every few visits with me  Orders Placed This Encounter  Procedures   MR BRAIN W WO CONTRAST   HISTORY OF PRESENT ILLNESS: Today 04/27/24 March 2025 JCV 0.29 negative. Having MRI cervical spine next week. Neck pain, right sided, radiating down right arm. Just finished steroid taper. Doing PT. MS is stable. Remains on Tysabri monthly. Vision is fine, b/b  are fine. Walking/balance is good. Has tizanidine  PRN for back spasms, doesn't take often. Remains on Vitamin D . Works full time.   Update 10/07/23 SS: MRI of the brain with and without contrast April 2024 showed no significant change. JCV 0.40 negative 08/14/23. TMJ issues, going to PT doing dry needling, mouth guard at night. MS is stable. Remains on Tysabri.  Has occasional back spasms, aching to left wrist.  Update 03/20/23 SS: JCV 02/19/23 0.37 indeterminate, negative inhibition assay. Remains on Tysabri, comes to our office. No issues. Feels MS doing fairly well. Joints in left hand hurt occasionally. Can have back spasms. Works full time, is an Art gallery manager, works remotely. Has been trying to walk daily. On Vitamin D . On Ozempic, lost 7 lbs, is now pre-diabetic.   HISTORY  UPDATE (09/03/22, VRP): Since last visit, doing well. Symptoms are stable. Severity is mild. No alleviating or aggravating factors. Tolerating tysabri.     UPDATE (02/18/22, VRP): Since last visit, doing well. Symptoms are stable. Severity is mild. No alleviating or aggravating factors. Tolerating tysabri. Had covid earlier this year (cough, tachycardia, fatigue; 2 weeks).   UPDATE (08/20/21, VRP): Since last visit, now on tysabri since Mar 22, 2021  doing well. MS and GI sxs stable.    UPDATE (02/13/21, VRP): Since last visit, having more injection site reactions from Copaxone which she started in November 2021.  Also having more generalized joint and body pains, more on the right side.  Also having some shooting pains from her lower back down her leg on the right side.    UPDATE (09/11/20 ,  VRP): Since last visit, doing well. Symptoms are stable. Severity is mild. No alleviating or aggravating factors. Tolerating meds. Lymphocytes in April 2021 were low again (0.2).    UPDATE (12/13/19, VRP): Since last visit, more stress / anxiety (mother in law living with them; work from home; pandemic) --> now more insomnia, right leg numbness,  back spasms. Tolerating gilenya .    UPDATE (04/15/19, VRP): Since last visit, doing well. Symptoms a re stable. No new issues. No alleviating or aggravating factors. Tolerating gilenya . S/p cataract procedure. Crohn's is stable; on stellara. Annual physical is pending. Needs follow up labs.   REVIEW OF SYSTEMS: Out of a complete 14 system review of symptoms, the patient complains only of the following symptoms, and all other reviewed systems are negative.  See HPI  ALLERGIES: Allergies  Allergen Reactions   Codeine Hives   Other Hives    Pt unsure Rebif Pt unsure    Interferon Beta-1a     Other reaction(s): hives   Penicillins Hives    HOME MEDICATIONS: Outpatient Medications Prior to Visit  Medication Sig Dispense Refill   Ascorbic Acid (VITAMIN C) 1000 MG tablet Take 2,000 mg by mouth.     Cholecalciferol 5000 units capsule Take by mouth daily.     fluticasone (FLONASE) 50 MCG/ACT nasal spray Place into both nostrils daily.     levocetirizine (XYZAL) 5 MG tablet Take 5 mg by mouth every evening.     lisinopril (ZESTRIL) 10 MG tablet Take 10 mg by mouth daily.     Multiple Vitamins-Minerals (MULTIPLE VITAMINS/WOMENS PO) daily.     natalizumab (TYSABRI) 300 MG/15ML injection Tysabri 300 mg/15 mL intravenous solution   1 mg every month by intraven. route.     OZEMPIC, 0.25 OR 0.5 MG/DOSE, 2 MG/3ML SOPN SMARTSIG:0.25 Milligram(s) SUB-Q Once a Week     tiZANidine  (ZANAFLEX ) 2 MG tablet Take 1-2 tablets (2-4 mg total) by mouth 2 (two) times daily as needed for muscle spasms. 30 tablet 1   valACYclovir (VALTREX) 500 MG tablet Take 500 mg by mouth 2 (two) times daily.     No facility-administered medications prior to visit.    PAST MEDICAL HISTORY: Past Medical History:  Diagnosis Date   Allergy    Crohn's disease (HCC)    Endometriosis    Herpes simplex without mention of complication    Hypertension    Migraine    Multiple sclerosis (HCC)     PAST SURGICAL HISTORY: Past  Surgical History:  Procedure Laterality Date   CESAREAN SECTION     CRYOTHERAPY     FOOT SURGERY     LAPAROSCOPY     LASIK Bilateral 2001   UMBILICAL HERNIA REPAIR      FAMILY HISTORY: Family History  Problem Relation Age of Onset   Diabetes Mother    Glaucoma Mother    Diabetes Father    Cancer Father        throat, lung with metastases   Diabetes Sister    Multiple sclerosis Sister    Glaucoma Other    Colon cancer Paternal Grandfather    Retinal detachment Paternal Grandfather     SOCIAL HISTORY: Social History   Socioeconomic History   Marital status: Married    Spouse name: Terrance   Number of children: 1   Years of education: Not on file   Highest education level: Not on file  Occupational History   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   Caffeine Use:  1 beverage daily   Social Drivers of Corporate investment banker Strain: Not on file  Food Insecurity: Low Risk  (05/13/2023)   Received from [facility]   Hunger Vital Sign    Worried About Running Out of Food in the Last Year: Never true    Within the past 12 months, the food you bought just didn't last and you didn't have money to get more: Not on file  Transportation Needs: No Transportation Needs (05/13/2023)   Received from [facility]    In the past 12 months, has lack of reliable transportation kept you from medical appointments, meetings, work or from getting things needed for daily living? : No  Physical Activity: Not on file  Stress: Not on file  Social Connections: Not on file  Intimate Partner  Violence: Not on file   PHYSICAL EXAM  Vitals:   04/27/24 1516  BP: (!) 163/80  Pulse: 86  Weight: 165 lb (74.8 kg)  Height: 5\' 1"  (1.549 m)   Body mass index is 31.18 kg/m.  Generalized: Well developed, in no acute distress  Neurological examination  Mentation: Alert oriented to time, place, history taking. Follows all commands speech and language fluent Cranial nerve II-XII: Pupils were equal round reactive to light. Extraocular movements were full, visual field were full on confrontational test. Facial sensation and strength were normal.  Head turning and shoulder shrug  were normal and symmetric. Motor: The motor testing reveals 5 over 5 strength of all 4 extremities. Good symmetric motor tone is noted throughout.  Sensory: Sensory testing is intact to soft touch on all 4 extremities. No evidence of extinction is noted.  Coordination: Cerebellar testing reveals good finger-nose-finger and heel-to-shin bilaterally.  Gait and station: Gait is normal. Reflexes: Deep tendon reflexes are symmetric and normal bilaterally.   DIAGNOSTIC DATA (LABS, IMAGING, TESTING) - I reviewed patient records, labs, notes, testing and imaging myself where available.  Lab Results  Component Value Date   WBC 8.7 02/04/2024   HGB 12.9 02/04/2024   HCT 39.7 02/04/2024   MCV 89 02/04/2024   PLT 212 02/04/2024      Component Value Date/Time   NA 138 03/20/2023 1535   K 4.2 03/20/2023 1535   CL 102 03/20/2023 1535   CO2 20 03/20/2023 1535   GLUCOSE 80 03/20/2023 1535   GLUCOSE 88 07/03/2007 0530   BUN 11 03/20/2023 1535   CREATININE 0.75 03/20/2023 1535   CALCIUM 10.2 03/20/2023 1535   PROT 6.7 03/20/2023 1535   ALBUMIN 4.7 03/20/2023 1535   AST 14 03/20/2023 1535   ALT 18 03/20/2023 1535   ALKPHOS 91 03/20/2023 1535   BILITOT 0.5 03/20/2023 1535   GFRNONAA 102 08/11/2019 1614   GFRAA 118 08/11/2019 1614   No results found for: "CHOL", "HDL", "LDLCALC", "LDLDIRECT", "TRIG", "CHOLHDL" No  results found for: "HGBA1C" No results found for: "VITAMINB12" No results found for: "TSH"  Jeanmarie Millet, AGNP-C, DNP 04/27/2024, 3:22 PM [facility] [address] [place], [place] [address] [phone]

## 2024-04-27 ENCOUNTER — Encounter: Payer: Self-pay | Admitting: Neurology

## 2024-04-27 ENCOUNTER — Ambulatory Visit (INDEPENDENT_AMBULATORY_CARE_PROVIDER_SITE_OTHER): Payer: 59 | Admitting: Neurology

## 2024-04-27 VITALS — BP 163/80 | HR 86 | Ht 61.0 in | Wt 165.0 lb

## 2024-04-27 DIAGNOSIS — G35 Multiple sclerosis: Secondary | ICD-10-CM | POA: Diagnosis not present

## 2024-04-27 NOTE — Addendum Note (Signed)
 Addended by: Wess Hammed on: 04/27/2024 03:41 PM   Modules accepted: Level of Service

## 2024-04-27 NOTE — Patient Instructions (Signed)
 Great to see you today.  Continue Tysabri.  Check MRI of the brain for MS surveillance.  Plan to check labs around September 2025.  Please continue to exercise.  Follow-up in 6 months.  Please reach out if needed.  Thanks!!

## 2024-04-28 ENCOUNTER — Telehealth: Payer: Self-pay | Admitting: Neurology

## 2024-04-28 NOTE — Telephone Encounter (Signed)
 [facility] NPR Case Number: [id number] sent to GI [phone] Sent to GI so they can add it to her MRI cervical already scheduled that was ordered by a different office.

## 2024-04-30 ENCOUNTER — Encounter: Payer: Self-pay | Admitting: Family Medicine

## 2024-05-02 ENCOUNTER — Ambulatory Visit
Admission: RE | Admit: 2024-05-02 | Discharge: 2024-05-02 | Disposition: A | Discharge status: Home / Self Care | Source: Ambulatory Visit | Attending: Family Medicine | Admitting: Family Medicine

## 2024-05-02 DIAGNOSIS — M5412 Radiculopathy, cervical region: Secondary | ICD-10-CM

## 2024-05-12 ENCOUNTER — Other Ambulatory Visit: Payer: Self-pay | Admitting: Obstetrics and Gynecology

## 2024-05-12 DIAGNOSIS — Z1231 Encounter for screening mammogram for malignant neoplasm of breast: Secondary | ICD-10-CM

## 2024-06-06 ENCOUNTER — Ambulatory Visit
Admission: RE | Admit: 2024-06-06 | Discharge: 2024-06-06 | Disposition: A | Discharge status: Home / Self Care | Source: Ambulatory Visit | Attending: Neurology | Admitting: Neurology

## 2024-06-06 DIAGNOSIS — G35 Multiple sclerosis: Secondary | ICD-10-CM

## 2024-06-06 MED ORDER — GADOPICLENOL 0.5 MMOL/ML IV SOLN
7.5000 mL | Freq: Once | INTRAVENOUS | Status: AC | PRN
  Administered 2024-06-06: 7.5 mL via INTRAVENOUS

## 2024-06-08 ENCOUNTER — Ambulatory Visit: Payer: Self-pay | Admitting: Neurology

## 2024-07-15 ENCOUNTER — Ambulatory Visit
Admission: RE | Admit: 2024-07-15 | Discharge: 2024-07-15 | Disposition: A | Discharge status: Home / Self Care | Source: Ambulatory Visit | Attending: Obstetrics and Gynecology | Admitting: Obstetrics and Gynecology

## 2024-07-15 DIAGNOSIS — Z1231 Encounter for screening mammogram for malignant neoplasm of breast: Secondary | ICD-10-CM

## 2024-07-26 ENCOUNTER — Telehealth: Payer: Self-pay | Admitting: Neurology

## 2024-07-26 DIAGNOSIS — G35 Multiple sclerosis: Secondary | ICD-10-CM

## 2024-07-26 NOTE — Telephone Encounter (Signed)
 Please call and ask to come for labs. Is due for routine labs on Tysabri. Thanks  Orders Placed This Encounter  Procedures   CBC with Differential/Platelet   Stratify JCV Ab (w/ Index) w/ Rflx   CMP

## 2024-08-02 ENCOUNTER — Other Ambulatory Visit (INDEPENDENT_AMBULATORY_CARE_PROVIDER_SITE_OTHER): Payer: Self-pay

## 2024-08-02 DIAGNOSIS — G35 Multiple sclerosis: Secondary | ICD-10-CM

## 2024-08-02 DIAGNOSIS — Z0289 Encounter for other administrative examinations: Secondary | ICD-10-CM

## 2024-08-03 ENCOUNTER — Telehealth: Payer: Self-pay | Admitting: Neurology

## 2024-08-03 ENCOUNTER — Ambulatory Visit: Payer: Self-pay | Admitting: Neurology

## 2024-08-03 LAB — COMPREHENSIVE METABOLIC PANEL WITH GFR
ALT: 32 IU/L (ref 0–32)
AST: 26 IU/L (ref 0–40)
Albumin: 4.7 g/dL (ref 3.8–4.9)
Alkaline Phosphatase: 71 IU/L (ref 44–121)
BUN/Creatinine Ratio: 15 (ref 9–23)
BUN: 12 mg/dL (ref 6–24)
Bilirubin Total: 0.7 mg/dL (ref 0.0–1.2)
CO2: 21 mmol/L (ref 20–29)
Calcium: 9.3 mg/dL (ref 8.7–10.2)
Chloride: 105 mmol/L (ref 96–106)
Creatinine, Ser: 0.81 mg/dL (ref 0.57–1.00)
Globulin, Total: 1.9 g/dL (ref 1.5–4.5)
Glucose: 98 mg/dL (ref 70–99)
Potassium: 4.4 mmol/L (ref 3.5–5.2)
Sodium: 140 mmol/L (ref 134–144)
Total Protein: 6.6 g/dL (ref 6.0–8.5)
eGFR: 88 mL/min/1.73 (ref >59)

## 2024-08-03 LAB — CBC WITH DIFFERENTIAL/PLATELET
Basophils Absolute: 0.1 x10E3/uL (ref 0.0–0.2)
Basos: 1 %
EOS (ABSOLUTE): 0.2 x10E3/uL (ref 0.0–0.4)
Eos: 3 %
Hematocrit: 40.8 % (ref 34.0–46.6)
Hemoglobin: 13 g/dL (ref 11.1–15.9)
Immature Grans (Abs): 0 x10E3/uL (ref 0.0–0.1)
Immature Granulocytes: 0 %
Lymphocytes Absolute: 3.6 x10E3/uL — ABNORMAL HIGH (ref 0.7–3.1)
Lymphs: 44 %
MCH: 28.9 pg (ref 26.6–33.0)
MCHC: 31.9 g/dL (ref 31.5–35.7)
MCV: 91 fL (ref 79–97)
Monocytes Absolute: 0.5 x10E3/uL (ref 0.1–0.9)
Monocytes: 7 %
NRBC: 2 % — ABNORMAL HIGH (ref 0–0)
Neutrophils Absolute: 3.7 x10E3/uL (ref 1.4–7.0)
Neutrophils: 45 %
Platelets: 214 x10E3/uL (ref 150–450)
RBC: 4.5 x10E6/uL (ref 3.77–5.28)
RDW: 13.4 % (ref 11.7–15.4)
WBC: 8.2 x10E3/uL (ref 3.4–10.8)

## 2024-08-03 NOTE — Telephone Encounter (Signed)
 This was drawn and placed in [facility] for pick up same day I remember personally printing the requisition and packaging this up for [facility]

## 2024-08-03 NOTE — Telephone Encounter (Signed)
 I noticed that patient is due for JCV. Lauraine, NP ordered in on 07/26/2024. I checked [facility] and there doesn't appear to be any pending JCV results from this week. Unsure if the JCV was drawn? There was also no phone note regarding JCV sent to [facility] and I don't se where [facility] is pending results either.

## 2024-08-03 NOTE — Telephone Encounter (Signed)
 Need to make sure JCV was collected. Thanks

## 2024-08-03 NOTE — Telephone Encounter (Signed)
 I know personally that it was draw because I packaged it myself and J webb cma placed in [facility] box.

## 2024-08-03 NOTE — Telephone Encounter (Signed)
 Notified lab tech that this has been completed

## 2024-08-09 NOTE — Telephone Encounter (Signed)
 Ashley Diaz

## 2024-08-18 ENCOUNTER — Other Ambulatory Visit

## 2024-08-25 LAB — SPECIMEN STATUS REPORT

## 2024-08-30 ENCOUNTER — Telehealth: Payer: Self-pay

## 2024-08-30 NOTE — Telephone Encounter (Signed)
 Form faxed back, confirmation received.

## 2024-11-02 ENCOUNTER — Ambulatory Visit: Admitting: Diagnostic Neuroimaging

## 2024-11-02 ENCOUNTER — Encounter: Payer: Self-pay | Admitting: Diagnostic Neuroimaging

## 2024-11-02 VITALS — BP 144/87 | HR 83 | Ht 61.0 in | Wt 165.4 lb

## 2024-11-02 DIAGNOSIS — G35D Multiple sclerosis, unspecified: Secondary | ICD-10-CM

## 2024-11-02 NOTE — Progress Notes (Signed)
 Chief Complaint  Patient presents with   RM 7     Patient is here alone for MS follow-up - no concerns or changes    History of Present Illness:  UPDATE (11/02/24, VRP): Since last visit, doing well. Tolerating tysabri. No new symptoms. Last saw Lauraine Born, NP in June 2025.   UPDATE (09/03/22, VRP): Since last visit, doing well. Symptoms are stable. Severity is mild. No alleviating or aggravating factors. Tolerating tysabri.    UPDATE (02/18/22, VRP): Since last visit, doing well. Symptoms are stable. Severity is mild. No alleviating or aggravating factors. Tolerating tysabri. Had covid earlier this year (cough, tachycardia, fatigue; 2 weeks).  UPDATE (08/20/21, VRP): Since last visit, now on tysabri since Mar 22, 2021  doing well. MS and GI sxs stable.   UPDATE (02/13/21, VRP): Since last visit, having more injection site reactions from Copaxone which she started in November 2021.  Also having more generalized joint and body pains, more on the right side.  Also having some shooting pains from her lower back down her leg on the right side.   UPDATE (09/11/20 , VRP): Since last visit, doing well. Symptoms are stable. Severity is mild. No alleviating or aggravating factors. Tolerating meds. Lymphocytes in April 2021 were low again (0.2).   UPDATE (12/13/19, VRP): Since last visit, more stress / anxiety (mother in law living with them; work from home; pandemic) --> now more insomnia, right leg numbness, back spasms. Tolerating gilenya .   UPDATE (04/15/19, VRP): Since last visit, doing well. Symptoms a re stable. No new issues. No alleviating or aggravating factors. Tolerating gilenya . S/p cataract procedure. Crohn's is stable; on stellara. Annual physical is pending. Needs follow up labs.    Observations/Objective:  GENERAL EXAM/CONSTITUTIONAL: Vitals:  Vitals:   11/02/24 1543  BP: (!) 144/87  Pulse: 83  SpO2: 99%  Weight: 165 lb 6.4 oz (75 kg)  Height: 5' 1 (1.549 m)   Body  mass index is 31.25 kg/m. Wt Readings from Last 3 Encounters:  11/02/24 165 lb 6.4 oz (75 kg)  04/27/24 165 lb (74.8 kg)  10/07/23 167 lb 6.4 oz (75.9 kg)   Patient is in no distress; well developed, nourished and groomed; neck is supple  CARDIOVASCULAR: Examination of carotid arteries is normal; no carotid bruits Regular rate and rhythm, no murmurs Examination of peripheral vascular system by observation and palpation is normal  EYES: Ophthalmoscopic exam of optic discs and posterior segments is normal; no papilledema or hemorrhages No results found.  MUSCULOSKELETAL: Gait, strength, tone, movements noted in Neurologic exam below  NEUROLOGIC: MENTAL STATUS:      No data to display         awake, alert, oriented to person, place and time recent and remote memory intact normal attention and concentration language fluent, comprehension intact, naming intact fund of knowledge appropriate  CRANIAL NERVE:  2nd - no papilledema on fundoscopic exam 2nd, 3rd, 4th, 6th - pupils equal and reactive to light, visual fields full to confrontation, extraocular muscles intact, no nystagmus 5th - facial sensation symmetric 7th - facial strength symmetric 8th - hearing intact 9th - palate elevates symmetrically, uvula midline 11th - shoulder shrug symmetric 12th - tongue protrusion midline  MOTOR:  normal bulk and tone, full strength in the BUE, BLE  SENSORY:  normal and symmetric to light touch  COORDINATION:  finger-nose-finger, fine finger movements normal  REFLEXES:  deep tendon reflexes present and symmetric  GAIT/STATION:  narrow based gait  Lab  Results  Component Value Date   WBC 8.2 08/02/2024   HGB 13.0 08/02/2024   HCT 40.8 08/02/2024   MCV 91 08/02/2024   PLT 214 08/02/2024    08/06/17 MRI brain (with and without) demonstrating: 1. There are a few small, round and ovoid, periventricular and subcortical chronic demyelinating plaques. 2. No acute  plaques. 3. No significant change from MRI on 12/27/15.  06/06/24 MRI scan of the brain with and without contrast showing multiple scattered supratentorial periventricular, subcortical, periatrial and pericallosal T2/FLAIR white matter hyperintensities in the periventricular region compatible with chronic demyelinating disease.  No acute enhancing lesions are noted.  There are incidental changes of chronic severe paranasal sinusitis particularly involving the maxillary sinuses overall no significant change compared with previous MRI dated 03/25/2023      Assessment and Plan:  53 y.o. year old female with multiple sclerosis. Paresthesias and swelling of her bilateral feet since Septemeber 2010, which have gradually improved. Diagnosed with multiple sclerosis by MRI brain, t-spine, labs and LP (12 OCBs). Was on rebif 04/27/10 until Feb 2012 (stopped b/c of possible rxns and also was trying to get pregnant). Now on gilenya  and doing well. Also with inflammatory bowel disease (crohn's dz), now on Stelara (ustekinumab; every 2 months pen injection at home).   Tried gilenya  but stopped due to low lymphocyte counts (April 2021). Tried copaxone since Nov 2021, but having injection site reactions. Now on tysabri since April 2022.     Dx:  1. MS (multiple sclerosis)     MULTIPLE SCLEROSIS (also with crohn's disease) - continue tysabri - continue clinical monitoring - continue vitamin D  - continue monitoring: CBC, CMP, JCV ab every 6; MRI annually  JOINT AND NERVE PAIN (resolved) - continue PT exercises  Orders Placed This Encounter  Procedures   Stratify JCV(TM) Ab w/Index   Follow Up Instructions:  - Return in about 6 months (around 05/03/2025) for with NP Teddi Born).       EDUARD FABIENE HANLON, MD 11/02/2024, 4:36 PM Certified in Neurology, Neurophysiology and Neuroimaging  [facility] [address] [place], [place] [address] [phone]

## 2024-11-10 LAB — STRATIFY JCV(TM) AB W/INDEX: JCV Index Value: 0.19

## 2024-11-12 ENCOUNTER — Ambulatory Visit: Payer: Self-pay | Admitting: Diagnostic Neuroimaging

## 2025-05-24 ENCOUNTER — Ambulatory Visit: Admitting: Neurology
